# Patient Record
Sex: Male | Born: 1965 | Race: White | Hispanic: No | Marital: Single | Smoking: Former smoker
Health system: Southern US, Community
[De-identification: ages and names within clinical notes are randomized; demographics above are authoritative.]

## PROBLEM LIST (undated history)

## (undated) DIAGNOSIS — I38 Endocarditis, valve unspecified: Secondary | ICD-10-CM

## (undated) DIAGNOSIS — F4310 Post-traumatic stress disorder, unspecified: Secondary | ICD-10-CM

## (undated) DIAGNOSIS — K219 Gastro-esophageal reflux disease without esophagitis: Secondary | ICD-10-CM

## (undated) DIAGNOSIS — M109 Gout, unspecified: Secondary | ICD-10-CM

## (undated) DIAGNOSIS — F32A Depression, unspecified: Secondary | ICD-10-CM

## (undated) DIAGNOSIS — N400 Enlarged prostate without lower urinary tract symptoms: Secondary | ICD-10-CM

## (undated) DIAGNOSIS — K5909 Other constipation: Secondary | ICD-10-CM

## (undated) DIAGNOSIS — J449 Chronic obstructive pulmonary disease, unspecified: Secondary | ICD-10-CM

## (undated) DIAGNOSIS — T4145XA Adverse effect of unspecified anesthetic, initial encounter: Secondary | ICD-10-CM

## (undated) DIAGNOSIS — T8859XA Other complications of anesthesia, initial encounter: Secondary | ICD-10-CM

## (undated) DIAGNOSIS — I82611 Acute embolism and thrombosis of superficial veins of right upper extremity: Secondary | ICD-10-CM

## (undated) DIAGNOSIS — J42 Unspecified chronic bronchitis: Secondary | ICD-10-CM

## (undated) DIAGNOSIS — R609 Edema, unspecified: Secondary | ICD-10-CM

## (undated) DIAGNOSIS — G8929 Other chronic pain: Secondary | ICD-10-CM

## (undated) DIAGNOSIS — I10 Essential (primary) hypertension: Secondary | ICD-10-CM

## (undated) DIAGNOSIS — R4182 Altered mental status, unspecified: Secondary | ICD-10-CM

## (undated) DIAGNOSIS — E669 Obesity, unspecified: Secondary | ICD-10-CM

## (undated) DIAGNOSIS — E785 Hyperlipidemia, unspecified: Secondary | ICD-10-CM

## (undated) DIAGNOSIS — F329 Major depressive disorder, single episode, unspecified: Secondary | ICD-10-CM

## (undated) HISTORY — DX: Chronic obstructive pulmonary disease, unspecified: J44.9

## (undated) HISTORY — PX: APPENDECTOMY: SHX54

## (undated) HISTORY — PX: NECK SURGERY: SHX720

## (undated) HISTORY — DX: Essential (primary) hypertension: I10

## (undated) HISTORY — PX: TRACHEOSTOMY: SUR1362

## (undated) HISTORY — PX: ESOPHAGOGASTRODUODENOSCOPY: SHX1529

## (undated) HISTORY — PX: TONSILLECTOMY: SUR1361

---

## 2004-11-25 ENCOUNTER — Emergency Department: Payer: Self-pay | Admitting: Unknown Physician Specialty

## 2007-01-31 ENCOUNTER — Ambulatory Visit: Payer: Self-pay | Admitting: Family Medicine

## 2008-06-19 ENCOUNTER — Ambulatory Visit: Payer: Self-pay | Admitting: Family Medicine

## 2008-09-24 ENCOUNTER — Encounter: Payer: Self-pay | Admitting: Internal Medicine

## 2008-10-01 ENCOUNTER — Encounter: Payer: Self-pay | Admitting: Internal Medicine

## 2008-10-22 ENCOUNTER — Ambulatory Visit: Payer: Self-pay | Admitting: Family Medicine

## 2008-10-27 ENCOUNTER — Encounter: Payer: Self-pay | Admitting: Internal Medicine

## 2008-11-19 ENCOUNTER — Ambulatory Visit: Payer: Self-pay | Admitting: Specialist

## 2008-12-01 ENCOUNTER — Ambulatory Visit: Payer: Self-pay | Admitting: Specialist

## 2009-09-24 ENCOUNTER — Ambulatory Visit: Payer: Self-pay | Admitting: Family Medicine

## 2009-10-09 ENCOUNTER — Ambulatory Visit: Payer: Self-pay | Admitting: Family Medicine

## 2009-10-14 ENCOUNTER — Ambulatory Visit: Payer: Self-pay | Admitting: Specialist

## 2010-07-05 ENCOUNTER — Ambulatory Visit: Payer: Self-pay | Admitting: Surgery

## 2010-07-07 ENCOUNTER — Ambulatory Visit: Payer: Self-pay | Admitting: Surgery

## 2010-07-09 LAB — PATHOLOGY REPORT

## 2011-01-27 ENCOUNTER — Ambulatory Visit: Payer: Self-pay | Admitting: Surgery

## 2011-02-03 ENCOUNTER — Ambulatory Visit: Payer: Self-pay | Admitting: Surgery

## 2011-02-07 LAB — PATHOLOGY REPORT

## 2011-09-05 DIAGNOSIS — L89309 Pressure ulcer of unspecified buttock, unspecified stage: Secondary | ICD-10-CM | POA: Insufficient documentation

## 2012-10-30 DIAGNOSIS — G5640 Causalgia of unspecified upper limb: Secondary | ICD-10-CM | POA: Insufficient documentation

## 2012-10-30 DIAGNOSIS — F4310 Post-traumatic stress disorder, unspecified: Secondary | ICD-10-CM | POA: Insufficient documentation

## 2012-10-30 DIAGNOSIS — M79673 Pain in unspecified foot: Secondary | ICD-10-CM | POA: Insufficient documentation

## 2012-10-30 DIAGNOSIS — M5030 Other cervical disc degeneration, unspecified cervical region: Secondary | ICD-10-CM | POA: Insufficient documentation

## 2012-10-30 DIAGNOSIS — F39 Unspecified mood [affective] disorder: Secondary | ICD-10-CM | POA: Insufficient documentation

## 2012-12-28 DIAGNOSIS — F329 Major depressive disorder, single episode, unspecified: Secondary | ICD-10-CM | POA: Insufficient documentation

## 2012-12-28 DIAGNOSIS — F32A Depression, unspecified: Secondary | ICD-10-CM | POA: Insufficient documentation

## 2013-07-18 ENCOUNTER — Emergency Department: Payer: Self-pay | Admitting: Internal Medicine

## 2013-12-10 ENCOUNTER — Ambulatory Visit: Payer: Self-pay | Admitting: Family Medicine

## 2014-01-02 ENCOUNTER — Emergency Department: Payer: Self-pay | Admitting: Emergency Medicine

## 2014-01-02 LAB — BASIC METABOLIC PANEL
Anion Gap: 4 — ABNORMAL LOW (ref 7–16)
BUN: 16 mg/dL (ref 7–18)
CALCIUM: 8.2 mg/dL — AB (ref 8.5–10.1)
CHLORIDE: 97 mmol/L — AB (ref 98–107)
CREATININE: 1.13 mg/dL (ref 0.60–1.30)
Co2: 29 mmol/L (ref 21–32)
EGFR (African American): >60
EGFR (Non-African Amer.): >60
GLUCOSE: 98 mg/dL (ref 65–99)
Osmolality: 262 (ref 275–301)
Potassium: 3.2 mmol/L — ABNORMAL LOW (ref 3.5–5.1)
SODIUM: 130 mmol/L — AB (ref 136–145)

## 2014-01-02 LAB — CBC
HCT: 36.3 % — ABNORMAL LOW (ref 40.0–52.0)
HGB: 12.2 g/dL — AB (ref 13.0–18.0)
MCH: 29 pg (ref 26.0–34.0)
MCHC: 33.5 g/dL (ref 32.0–36.0)
MCV: 87 fL (ref 80–100)
Platelet: 215 10*3/uL (ref 150–440)
RBC: 4.2 10*6/uL — ABNORMAL LOW (ref 4.40–5.90)
RDW: 15.1 % — ABNORMAL HIGH (ref 11.5–14.5)
WBC: 5.4 10*3/uL (ref 3.8–10.6)

## 2014-01-02 LAB — TROPONIN I: Troponin-I: <0.02 ng/mL

## 2014-05-01 ENCOUNTER — Ambulatory Visit: Payer: Self-pay | Admitting: Gastroenterology

## 2014-05-06 ENCOUNTER — Emergency Department: Payer: Self-pay | Admitting: Emergency Medicine

## 2014-05-06 LAB — URINALYSIS, COMPLETE
BACTERIA: NONE SEEN
Bilirubin,UR: NEGATIVE
Glucose,UR: NEGATIVE mg/dL (ref 0–75)
Ketone: NEGATIVE
LEUKOCYTE ESTERASE: NEGATIVE
Nitrite: NEGATIVE
Ph: 7 (ref 4.5–8.0)
Protein: NEGATIVE
SPECIFIC GRAVITY: 1.011 (ref 1.003–1.030)
Squamous Epithelial: NONE SEEN
WBC UR: NONE SEEN /HPF (ref 0–5)

## 2014-05-06 LAB — CBC WITH DIFFERENTIAL/PLATELET
BASOS ABS: 0.1 10*3/uL (ref 0.0–0.1)
BASOS PCT: 0.9 %
EOS PCT: 4.8 %
Eosinophil #: 0.4 10*3/uL (ref 0.0–0.7)
HCT: 37.6 % — ABNORMAL LOW (ref 40.0–52.0)
HGB: 12.3 g/dL — ABNORMAL LOW (ref 13.0–18.0)
Lymphocyte #: 2.2 10*3/uL (ref 1.0–3.6)
Lymphocyte %: 23.9 %
MCH: 29.7 pg (ref 26.0–34.0)
MCHC: 32.6 g/dL (ref 32.0–36.0)
MCV: 91 fL (ref 80–100)
Monocyte #: 0.7 x10 3/mm (ref 0.2–1.0)
Monocyte %: 7.5 %
Neutrophil #: 5.7 10*3/uL (ref 1.4–6.5)
Neutrophil %: 62.9 %
Platelet: 294 10*3/uL (ref 150–440)
RBC: 4.13 10*6/uL — ABNORMAL LOW (ref 4.40–5.90)
RDW: 16.2 % — AB (ref 11.5–14.5)
WBC: 9 10*3/uL (ref 3.8–10.6)

## 2014-05-06 LAB — COMPREHENSIVE METABOLIC PANEL
Albumin: 3.5 g/dL (ref 3.4–5.0)
Alkaline Phosphatase: 139 U/L — ABNORMAL HIGH
Anion Gap: 9 (ref 7–16)
BUN: 16 mg/dL (ref 7–18)
Bilirubin,Total: 0.2 mg/dL (ref 0.2–1.0)
Calcium, Total: 8.4 mg/dL — ABNORMAL LOW (ref 8.5–10.1)
Chloride: 103 mmol/L (ref 98–107)
Co2: 25 mmol/L (ref 21–32)
Creatinine: 1.18 mg/dL (ref 0.60–1.30)
EGFR (African American): >60
EGFR (Non-African Amer.): >60
Glucose: 132 mg/dL — ABNORMAL HIGH (ref 65–99)
Osmolality: 277 (ref 275–301)
Potassium: 4.3 mmol/L (ref 3.5–5.1)
SGOT(AST): 34 U/L (ref 15–37)
SGPT (ALT): 43 U/L
Sodium: 137 mmol/L (ref 136–145)
Total Protein: 7.5 g/dL (ref 6.4–8.2)

## 2014-06-24 ENCOUNTER — Emergency Department: Payer: Self-pay | Admitting: Emergency Medicine

## 2014-06-24 LAB — BASIC METABOLIC PANEL
Anion Gap: 7 (ref 7–16)
BUN: 20 mg/dL — ABNORMAL HIGH (ref 7–18)
CALCIUM: 8.3 mg/dL — AB (ref 8.5–10.1)
CREATININE: 1.21 mg/dL (ref 0.60–1.30)
Chloride: 103 mmol/L (ref 98–107)
Co2: 26 mmol/L (ref 21–32)
EGFR (African American): >60
EGFR (Non-African Amer.): >60
Glucose: 100 mg/dL — ABNORMAL HIGH (ref 65–99)
Osmolality: 275 (ref 275–301)
Potassium: 4.3 mmol/L (ref 3.5–5.1)
SODIUM: 136 mmol/L (ref 136–145)

## 2014-06-24 LAB — CBC
HCT: 37.9 % — ABNORMAL LOW (ref 40.0–52.0)
HGB: 11.9 g/dL — ABNORMAL LOW (ref 13.0–18.0)
MCH: 29.8 pg (ref 26.0–34.0)
MCHC: 31.5 g/dL — ABNORMAL LOW (ref 32.0–36.0)
MCV: 95 fL (ref 80–100)
Platelet: 241 10*3/uL (ref 150–440)
RBC: 4.01 10*6/uL — AB (ref 4.40–5.90)
RDW: 15.5 % — ABNORMAL HIGH (ref 11.5–14.5)
WBC: 8.6 10*3/uL (ref 3.8–10.6)

## 2014-06-24 LAB — TROPONIN I: Troponin-I: <0.02 ng/mL

## 2014-06-27 ENCOUNTER — Emergency Department: Payer: Self-pay | Admitting: Emergency Medicine

## 2014-06-27 LAB — COMPREHENSIVE METABOLIC PANEL
ALBUMIN: 3.6 g/dL (ref 3.4–5.0)
Alkaline Phosphatase: 136 U/L — ABNORMAL HIGH
Anion Gap: 3 — ABNORMAL LOW (ref 7–16)
BILIRUBIN TOTAL: 0.2 mg/dL (ref 0.2–1.0)
BUN: 13 mg/dL (ref 7–18)
Calcium, Total: 8.2 mg/dL — ABNORMAL LOW (ref 8.5–10.1)
Chloride: 107 mmol/L (ref 98–107)
Co2: 26 mmol/L (ref 21–32)
Creatinine: 1.21 mg/dL (ref 0.60–1.30)
EGFR (African American): >60
GLUCOSE: 88 mg/dL (ref 65–99)
POTASSIUM: 4.3 mmol/L (ref 3.5–5.1)
SGOT(AST): 34 U/L (ref 15–37)
SGPT (ALT): 33 U/L
Sodium: 136 mmol/L (ref 136–145)
Total Protein: 7.1 g/dL (ref 6.4–8.2)

## 2014-06-27 LAB — CBC WITH DIFFERENTIAL/PLATELET
BASOS PCT: 0.7 %
Basophil #: 0 10*3/uL (ref 0.0–0.1)
EOS ABS: 0.3 10*3/uL (ref 0.0–0.7)
EOS PCT: 4.3 %
HCT: 35.1 % — AB (ref 40.0–52.0)
HGB: 11.3 g/dL — ABNORMAL LOW (ref 13.0–18.0)
Lymphocyte #: 1.5 10*3/uL (ref 1.0–3.6)
Lymphocyte %: 20.2 %
MCH: 30.2 pg (ref 26.0–34.0)
MCHC: 32.2 g/dL (ref 32.0–36.0)
MCV: 94 fL (ref 80–100)
MONOS PCT: 6.9 %
Monocyte #: 0.5 x10 3/mm (ref 0.2–1.0)
NEUTROS ABS: 5 10*3/uL (ref 1.4–6.5)
NEUTROS PCT: 67.9 %
PLATELETS: 240 10*3/uL (ref 150–440)
RBC: 3.74 10*6/uL — ABNORMAL LOW (ref 4.40–5.90)
RDW: 15.5 % — AB (ref 11.5–14.5)
WBC: 7.3 10*3/uL (ref 3.8–10.6)

## 2014-06-27 LAB — URINALYSIS, COMPLETE
BILIRUBIN, UR: NEGATIVE
Bacteria: NONE SEEN
GLUCOSE, UR: NEGATIVE mg/dL (ref 0–75)
Ketone: NEGATIVE
LEUKOCYTE ESTERASE: NEGATIVE
Nitrite: NEGATIVE
Ph: 6 (ref 4.5–8.0)
Protein: NEGATIVE
SPECIFIC GRAVITY: 1.01 (ref 1.003–1.030)
WBC UR: 1 /HPF (ref 0–5)

## 2014-06-27 LAB — PROTIME-INR
INR: 1.1
Prothrombin Time: 13.9 secs (ref 11.5–14.7)

## 2014-06-27 LAB — LIPASE, BLOOD: LIPASE: 63 U/L — AB (ref 73–393)

## 2014-07-07 ENCOUNTER — Ambulatory Visit: Payer: Self-pay | Admitting: Gastroenterology

## 2014-07-10 LAB — PATHOLOGY REPORT

## 2014-07-27 ENCOUNTER — Emergency Department: Payer: Self-pay | Admitting: Emergency Medicine

## 2014-07-27 LAB — CBC
HCT: 40.5 % (ref 40.0–52.0)
HGB: 13.2 g/dL (ref 13.0–18.0)
MCH: 30.8 pg (ref 26.0–34.0)
MCHC: 32.6 g/dL (ref 32.0–36.0)
MCV: 95 fL (ref 80–100)
Platelet: 267 10*3/uL (ref 150–440)
RBC: 4.29 10*6/uL — AB (ref 4.40–5.90)
RDW: 14.6 % — ABNORMAL HIGH (ref 11.5–14.5)
WBC: 10.4 10*3/uL (ref 3.8–10.6)

## 2014-07-27 LAB — COMPREHENSIVE METABOLIC PANEL
ANION GAP: 9 (ref 7–16)
Albumin: 3.9 g/dL (ref 3.4–5.0)
Alkaline Phosphatase: 108 U/L
BUN: 25 mg/dL — AB (ref 7–18)
Bilirubin,Total: 0.4 mg/dL (ref 0.2–1.0)
CHLORIDE: 101 mmol/L (ref 98–107)
CREATININE: 1.29 mg/dL (ref 0.60–1.30)
Calcium, Total: 8.4 mg/dL — ABNORMAL LOW (ref 8.5–10.1)
Co2: 25 mmol/L (ref 21–32)
EGFR (African American): >60
EGFR (Non-African Amer.): >60
GLUCOSE: 114 mg/dL — AB (ref 65–99)
Osmolality: 275 (ref 275–301)
Potassium: 4.3 mmol/L (ref 3.5–5.1)
SGOT(AST): 27 U/L (ref 15–37)
SGPT (ALT): 31 U/L
SODIUM: 135 mmol/L — AB (ref 136–145)
TOTAL PROTEIN: 7.4 g/dL (ref 6.4–8.2)

## 2014-07-27 LAB — TSH: Thyroid Stimulating Horm: 1.26 u[IU]/mL

## 2014-07-27 LAB — LIPASE, BLOOD: LIPASE: 57 U/L — AB (ref 73–393)

## 2014-07-27 LAB — PRO B NATRIURETIC PEPTIDE: B-Type Natriuretic Peptide: 108 pg/mL (ref 0–125)

## 2014-07-27 LAB — PROTIME-INR
INR: 1.9
Prothrombin Time: 21.2 secs — ABNORMAL HIGH (ref 11.5–14.7)

## 2014-07-27 LAB — TROPONIN I

## 2014-11-03 DIAGNOSIS — I10 Essential (primary) hypertension: Secondary | ICD-10-CM | POA: Insufficient documentation

## 2014-11-03 DIAGNOSIS — E785 Hyperlipidemia, unspecified: Secondary | ICD-10-CM | POA: Insufficient documentation

## 2014-11-03 DIAGNOSIS — R339 Retention of urine, unspecified: Secondary | ICD-10-CM | POA: Insufficient documentation

## 2014-11-03 DIAGNOSIS — J449 Chronic obstructive pulmonary disease, unspecified: Secondary | ICD-10-CM | POA: Insufficient documentation

## 2014-11-03 DIAGNOSIS — I38 Endocarditis, valve unspecified: Secondary | ICD-10-CM | POA: Insufficient documentation

## 2014-11-06 DIAGNOSIS — R310 Gross hematuria: Secondary | ICD-10-CM | POA: Insufficient documentation

## 2015-02-25 ENCOUNTER — Other Ambulatory Visit: Payer: Self-pay | Admitting: Family Medicine

## 2015-02-25 ENCOUNTER — Other Ambulatory Visit: Payer: Medicare Other

## 2015-02-26 LAB — PROTIME-INR
INR: 2 — AB (ref 0.8–1.2)
PROTHROMBIN TIME: 21.3 s — AB (ref 9.1–12.0)

## 2015-02-27 DIAGNOSIS — G8929 Other chronic pain: Secondary | ICD-10-CM | POA: Insufficient documentation

## 2015-02-27 DIAGNOSIS — M545 Low back pain, unspecified: Secondary | ICD-10-CM | POA: Insufficient documentation

## 2015-03-06 ENCOUNTER — Encounter: Payer: Self-pay | Admitting: Family Medicine

## 2015-04-09 ENCOUNTER — Other Ambulatory Visit: Payer: Self-pay | Admitting: Family Medicine

## 2015-04-09 ENCOUNTER — Other Ambulatory Visit: Payer: Self-pay

## 2015-04-09 DIAGNOSIS — J441 Chronic obstructive pulmonary disease with (acute) exacerbation: Secondary | ICD-10-CM

## 2015-04-09 DIAGNOSIS — I10 Essential (primary) hypertension: Secondary | ICD-10-CM

## 2015-04-09 MED ORDER — METOPROLOL TARTRATE 50 MG PO TABS: 50.0000 mg | ORAL_TABLET | Freq: Two times a day (BID) | ORAL | Status: DC

## 2015-04-23 ENCOUNTER — Other Ambulatory Visit: Payer: Medicare Other

## 2015-04-23 DIAGNOSIS — Z7901 Long term (current) use of anticoagulants: Secondary | ICD-10-CM

## 2015-04-24 LAB — PROTIME-INR
INR: 3.1 — ABNORMAL HIGH (ref 0.8–1.2)
PROTHROMBIN TIME: 32.7 s — AB (ref 9.1–12.0)

## 2015-04-27 ENCOUNTER — Other Ambulatory Visit: Payer: Self-pay | Admitting: Family Medicine

## 2015-04-27 DIAGNOSIS — K219 Gastro-esophageal reflux disease without esophagitis: Secondary | ICD-10-CM

## 2015-05-01 ENCOUNTER — Other Ambulatory Visit: Payer: Self-pay

## 2015-06-02 ENCOUNTER — Other Ambulatory Visit: Payer: Self-pay | Admitting: Family Medicine

## 2015-06-02 DIAGNOSIS — J449 Chronic obstructive pulmonary disease, unspecified: Secondary | ICD-10-CM

## 2015-06-04 ENCOUNTER — Other Ambulatory Visit: Payer: Self-pay

## 2015-06-23 ENCOUNTER — Other Ambulatory Visit: Payer: Medicare Other

## 2015-06-23 DIAGNOSIS — Z7901 Long term (current) use of anticoagulants: Secondary | ICD-10-CM

## 2015-06-24 ENCOUNTER — Other Ambulatory Visit: Payer: Self-pay

## 2015-06-24 LAB — PROTIME-INR
INR: 1.1 (ref 0.8–1.2)
Prothrombin Time: 11.7 s (ref 9.1–12.0)

## 2015-07-10 ENCOUNTER — Other Ambulatory Visit: Payer: Self-pay | Admitting: Family Medicine

## 2015-07-14 ENCOUNTER — Other Ambulatory Visit: Payer: Medicare Other

## 2015-07-14 DIAGNOSIS — Z7901 Long term (current) use of anticoagulants: Secondary | ICD-10-CM

## 2015-07-15 LAB — PROTIME-INR
INR: 2.2 — AB (ref 0.8–1.2)
PROTHROMBIN TIME: 22.1 s — AB (ref 9.1–12.0)

## 2015-07-24 ENCOUNTER — Other Ambulatory Visit: Payer: Self-pay | Admitting: Family Medicine

## 2015-08-31 ENCOUNTER — Other Ambulatory Visit: Payer: Medicare Other

## 2015-08-31 DIAGNOSIS — Z7901 Long term (current) use of anticoagulants: Secondary | ICD-10-CM

## 2015-09-01 LAB — PROTIME-INR
INR: 2.8 — AB (ref 0.8–1.2)
Prothrombin Time: 28.1 s — ABNORMAL HIGH (ref 9.1–12.0)

## 2015-10-20 ENCOUNTER — Other Ambulatory Visit: Payer: Medicare Other

## 2015-10-20 DIAGNOSIS — N400 Enlarged prostate without lower urinary tract symptoms: Secondary | ICD-10-CM

## 2015-10-20 DIAGNOSIS — I10 Essential (primary) hypertension: Secondary | ICD-10-CM

## 2015-10-20 DIAGNOSIS — Z7901 Long term (current) use of anticoagulants: Secondary | ICD-10-CM

## 2015-10-20 DIAGNOSIS — Z125 Encounter for screening for malignant neoplasm of prostate: Secondary | ICD-10-CM

## 2015-10-21 LAB — RENAL FUNCTION PANEL
Albumin: 4.4 g/dL (ref 3.5–5.5)
BUN / CREAT RATIO: 11 (ref 9–20)
BUN: 14 mg/dL (ref 6–24)
CO2: 21 mmol/L (ref 18–29)
CREATININE: 1.3 mg/dL — AB (ref 0.76–1.27)
Calcium: 9.1 mg/dL (ref 8.7–10.2)
Chloride: 96 mmol/L (ref 96–106)
GFR, EST AFRICAN AMERICAN: 73 mL/min/{1.73_m2} (ref >59)
GFR, EST NON AFRICAN AMERICAN: 63 mL/min/{1.73_m2} (ref >59)
GLUCOSE: 123 mg/dL — AB (ref 65–99)
POTASSIUM: 5.2 mmol/L (ref 3.5–5.2)
Phosphorus: 3.9 mg/dL (ref 2.5–4.5)
SODIUM: 136 mmol/L (ref 134–144)

## 2015-10-21 LAB — LIPID PANEL
CHOL/HDL RATIO: 5.3 ratio — AB (ref 0.0–5.0)
Cholesterol, Total: 189 mg/dL (ref 100–199)
HDL: 36 mg/dL — ABNORMAL LOW (ref >39)
LDL CALC: 100 mg/dL — AB (ref 0–99)
Triglycerides: 264 mg/dL — ABNORMAL HIGH (ref 0–149)
VLDL Cholesterol Cal: 53 mg/dL — ABNORMAL HIGH (ref 5–40)

## 2015-10-21 LAB — PROTIME-INR
INR: 2.5 — AB (ref 0.8–1.2)
PROTHROMBIN TIME: 25 s — AB (ref 9.1–12.0)

## 2015-10-21 LAB — PSA

## 2015-11-05 ENCOUNTER — Ambulatory Visit (INDEPENDENT_AMBULATORY_CARE_PROVIDER_SITE_OTHER): Payer: Medicare Other | Admitting: Family Medicine

## 2015-11-05 ENCOUNTER — Encounter: Payer: Self-pay | Admitting: Family Medicine

## 2015-11-05 VITALS — BP 100/64 | HR 82 | Ht 72.0 in | Wt 276.0 lb

## 2015-11-05 DIAGNOSIS — E790 Hyperuricemia without signs of inflammatory arthritis and tophaceous disease: Secondary | ICD-10-CM

## 2015-11-05 DIAGNOSIS — N400 Enlarged prostate without lower urinary tract symptoms: Secondary | ICD-10-CM

## 2015-11-05 DIAGNOSIS — Z114 Encounter for screening for human immunodeficiency virus [HIV]: Secondary | ICD-10-CM | POA: Diagnosis not present

## 2015-11-05 DIAGNOSIS — K219 Gastro-esophageal reflux disease without esophagitis: Secondary | ICD-10-CM

## 2015-11-05 DIAGNOSIS — I482 Chronic atrial fibrillation, unspecified: Secondary | ICD-10-CM

## 2015-11-05 DIAGNOSIS — Z1159 Encounter for screening for other viral diseases: Secondary | ICD-10-CM | POA: Diagnosis not present

## 2015-11-05 MED ORDER — ALLOPURINOL 300 MG PO TABS: ORAL_TABLET | ORAL | Status: DC

## 2015-11-05 MED ORDER — OMEPRAZOLE 20 MG PO CPDR: DELAYED_RELEASE_CAPSULE | ORAL | Status: DC

## 2015-11-05 MED ORDER — TAMSULOSIN HCL 0.4 MG PO CAPS: ORAL_CAPSULE | ORAL | Status: DC

## 2015-11-05 NOTE — Progress Notes (Signed)
Name: Kenneth Wood   MRN: [id number]    DOB: Oct 26, 1965   Date:11/05/2015       Progress Note  Subjective  Chief Complaint  Chief Complaint  Patient presents with  . Gout  . Gastroesophageal Reflux  . Benign Prostatic Hypertrophy    Gastroesophageal Reflux He reports no abdominal pain, no belching, no chest pain, no choking, no coughing, no dysphagia, no early satiety, no globus sensation, no heartburn, no hoarse voice, no nausea, no sore throat, no stridor, no tooth decay, no water brash or no wheezing. This is a chronic problem. The current episode started more than 1 year ago. The problem occurs rarely. The problem has been gradually improving. Nothing aggravates the symptoms. Pertinent negatives include no anemia, melena, muscle weakness, orthopnea or weight loss. There are no known risk factors. He has tried a PPI for the symptoms. The treatment provided moderate relief.  Benign Prostatic Hypertrophy This is a chronic problem. The current episode started more than 1 year ago. The problem is unchanged. Irritative symptoms do not include frequency, nocturia or urgency. Obstructive symptoms do not include dribbling, incomplete emptying, an intermittent stream, a slower stream or straining. Pertinent negatives include no chills, dysuria, hematuria, hesitancy, nausea or vomiting. Nothing aggravates the symptoms. Past treatments include tamsulosin. The treatment provided mild relief.  Other This is a recurrent (hyperuricacidemia) problem. The current episode started more than 1 year ago. The problem has been waxing and waning. Pertinent negatives include no abdominal pain, anorexia, chest pain, chills, coughing, fever, headaches, myalgias, nausea, neck pain, numbness, rash, sore throat, urinary symptoms or vomiting. Nothing aggravates the symptoms. He has tried nothing for the symptoms.    No problem-specific assessment & plan notes found for this encounter.   No past medical history on  file.  Past Surgical History  Procedure Laterality Date  . Neck surgery      AA    No family history on file.  Social History   Social History  . Marital Status: Single    Spouse Name: N/A  . Number of Children: N/A  . Years of Education: N/A   Occupational History  . Not on file.   Social History Main Topics  . Smoking status: Former Research scientist (life sciences)  . Smokeless tobacco: Not on file  . Alcohol Use: Not on file  . Drug Use: Not on file  . Sexual Activity: Not on file   Other Topics Concern  . Not on file   Social History Narrative  . No narrative on file    Allergies  Allergen Reactions  . Oxycodone-Acetaminophen Other (See Comments)  . Shellfish Allergy Other (See Comments)     Review of Systems  Constitutional: Negative for fever, chills, weight loss and malaise/fatigue.  HENT: Negative for ear discharge, ear pain, hoarse voice and sore throat.   Eyes: Negative for blurred vision.  Respiratory: Negative for cough, sputum production, choking, shortness of breath and wheezing.   Cardiovascular: Negative for chest pain, palpitations and leg swelling.  Gastrointestinal: Negative for heartburn, dysphagia, nausea, vomiting, abdominal pain, diarrhea, constipation, blood in stool, melena and anorexia.  Genitourinary: Negative for dysuria, hesitancy, urgency, frequency, hematuria, incomplete emptying and nocturia.  Musculoskeletal: Negative for myalgias, back pain, joint pain, muscle weakness and neck pain.  Skin: Negative for rash.  Neurological: Negative for dizziness, tingling, sensory change, focal weakness, numbness and headaches.  Endo/Heme/Allergies: Negative for environmental allergies and polydipsia. Does not bruise/bleed easily.  Psychiatric/Behavioral: Negative for depression and suicidal ideas. The patient  is not nervous/anxious and does not have insomnia.      Objective  Filed Vitals:   11/05/15 1431  BP: 100/64  Pulse: 82  Height: 6' (1.829 m)  Weight:  276 lb (125.193 kg)    Physical Exam  Constitutional: He is oriented to person, place, and time and well-developed, well-nourished, and in no distress.  HENT:  Head: Normocephalic.  Right Ear: Hearing, tympanic membrane, external ear and ear canal normal.  Left Ear: Hearing, tympanic membrane, external ear and ear canal normal.  Nose: Nose normal.  Mouth/Throat: Oropharynx is clear and moist.  Eyes: Conjunctivae and EOM are normal. Pupils are equal, round, and reactive to light. Right eye exhibits no discharge. Left eye exhibits no discharge. No scleral icterus.  Neck: Normal range of motion. Neck supple. No JVD present. No tracheal deviation present. No thyromegaly present.  Cardiovascular: Normal rate, regular rhythm, normal heart sounds and intact distal pulses.  Exam reveals no gallop and no friction rub.   No murmur heard. Pulmonary/Chest: Breath sounds normal. No respiratory distress. He has no wheezes. He has no rales.  Abdominal: Soft. Bowel sounds are normal. He exhibits no mass. There is no hepatosplenomegaly. There is no tenderness. There is no rebound, no guarding and no CVA tenderness.  Genitourinary: Rectum normal and prostate normal. Rectal exam shows no external hemorrhoid and no internal hemorrhoid.  Musculoskeletal: Normal range of motion. He exhibits no edema or tenderness.  Lymphadenopathy:    He has no cervical adenopathy.  Neurological: He is alert and oriented to person, place, and time. He has normal sensation, normal strength, normal reflexes and intact cranial nerves. No cranial nerve deficit.  Skin: Skin is warm. No rash noted.  Psychiatric: Mood and affect normal.      Assessment & Plan  Problem List Items Addressed This Visit    None    Visit Diagnoses    Gastroesophageal reflux disease, esophagitis presence not specified    -  Primary    Relevant Medications    lubiprostone (AMITIZA) 24 MCG capsule    omeprazole (PRILOSEC) 20 MG capsule    BPH  (benign prostatic hypertrophy)        Relevant Medications    tamsulosin (FLOMAX) 0.4 MG CAPS capsule    Other Relevant Orders    PSA    Hyperuricemia        Relevant Medications    allopurinol (ZYLOPRIM) 300 MG tablet    GERD without esophagitis        Relevant Medications    lubiprostone (AMITIZA) 24 MCG capsule    omeprazole (PRILOSEC) 20 MG capsule    Screening for HIV (human immunodeficiency virus)        Need for hepatitis C screening test        Relevant Orders    Hepatitis C antibody    Chronic atrial fibrillation ([place])        Relevant Orders    INR/PT         Dr. Melea Prezioso [facility]  11/05/2015

## 2015-11-06 LAB — PROTIME-INR
INR: 2.3 — AB (ref 0.8–1.2)
PROTHROMBIN TIME: 23.9 s — AB (ref 9.1–12.0)

## 2015-11-06 LAB — PSA

## 2015-11-06 LAB — HEPATITIS C ANTIBODY: Hep C Virus Ab: <0.1 s/co ratio (ref 0.0–0.9)

## 2015-11-09 ENCOUNTER — Other Ambulatory Visit: Payer: Self-pay

## 2015-11-12 ENCOUNTER — Other Ambulatory Visit: Payer: Self-pay

## 2015-11-30 ENCOUNTER — Other Ambulatory Visit: Payer: Self-pay

## 2015-11-30 MED ORDER — ALBUTEROL SULFATE (2.5 MG/3ML) 0.083% IN NEBU: INHALATION_SOLUTION | RESPIRATORY_TRACT | Status: DC

## 2015-12-15 ENCOUNTER — Other Ambulatory Visit: Payer: Medicare Other

## 2015-12-15 DIAGNOSIS — Z7901 Long term (current) use of anticoagulants: Secondary | ICD-10-CM

## 2015-12-16 LAB — PROTIME-INR
INR: 2 — AB (ref 0.8–1.2)
Prothrombin Time: 20.6 s — ABNORMAL HIGH (ref 9.1–12.0)

## 2015-12-17 ENCOUNTER — Other Ambulatory Visit: Payer: Self-pay

## 2016-01-25 ENCOUNTER — Other Ambulatory Visit: Payer: Medicare Other

## 2016-01-25 DIAGNOSIS — Z7901 Long term (current) use of anticoagulants: Principal | ICD-10-CM

## 2016-01-25 DIAGNOSIS — Z5181 Encounter for therapeutic drug level monitoring: Secondary | ICD-10-CM

## 2016-01-26 LAB — PROTIME-INR
INR: 2.3 — ABNORMAL HIGH (ref 0.8–1.2)
Prothrombin Time: 23.7 s — ABNORMAL HIGH (ref 9.1–12.0)

## 2016-02-01 ENCOUNTER — Other Ambulatory Visit: Payer: Self-pay | Admitting: Family Medicine

## 2016-02-13 ENCOUNTER — Other Ambulatory Visit: Payer: Self-pay | Admitting: Family Medicine

## 2016-03-08 ENCOUNTER — Other Ambulatory Visit: Payer: Self-pay

## 2016-03-08 ENCOUNTER — Other Ambulatory Visit: Payer: Medicare Other

## 2016-03-08 DIAGNOSIS — Z7901 Long term (current) use of anticoagulants: Secondary | ICD-10-CM

## 2016-03-09 LAB — PROTIME-INR
INR: 2.5 — ABNORMAL HIGH (ref 0.8–1.2)
Prothrombin Time: 25.4 s — ABNORMAL HIGH (ref 9.1–12.0)

## 2016-03-17 ENCOUNTER — Other Ambulatory Visit: Payer: Self-pay | Admitting: Family Medicine

## 2016-04-01 ENCOUNTER — Other Ambulatory Visit: Payer: Self-pay | Admitting: Family Medicine

## 2016-05-02 ENCOUNTER — Other Ambulatory Visit: Payer: Self-pay | Admitting: Family Medicine

## 2016-05-09 ENCOUNTER — Other Ambulatory Visit: Payer: Self-pay | Admitting: Family Medicine

## 2016-05-10 ENCOUNTER — Encounter: Payer: Self-pay | Admitting: Family Medicine

## 2016-05-10 ENCOUNTER — Ambulatory Visit (INDEPENDENT_AMBULATORY_CARE_PROVIDER_SITE_OTHER): Payer: Medicare Other | Admitting: Family Medicine

## 2016-05-10 VITALS — BP 120/74 | HR 60 | Ht 72.0 in | Wt 261.0 lb

## 2016-05-10 DIAGNOSIS — I10 Essential (primary) hypertension: Secondary | ICD-10-CM | POA: Diagnosis not present

## 2016-05-10 DIAGNOSIS — Z7901 Long term (current) use of anticoagulants: Secondary | ICD-10-CM | POA: Diagnosis not present

## 2016-05-10 DIAGNOSIS — K219 Gastro-esophageal reflux disease without esophagitis: Secondary | ICD-10-CM | POA: Diagnosis not present

## 2016-05-10 DIAGNOSIS — N400 Enlarged prostate without lower urinary tract symptoms: Secondary | ICD-10-CM

## 2016-05-10 MED ORDER — OMEPRAZOLE 20 MG PO CPDR: DELAYED_RELEASE_CAPSULE | ORAL | 1 refills | Status: DC

## 2016-05-10 MED ORDER — TAMSULOSIN HCL 0.4 MG PO CAPS: ORAL_CAPSULE | ORAL | 1 refills | Status: DC

## 2016-05-10 MED ORDER — LISINOPRIL 10 MG PO TABS: 10.0000 mg | ORAL_TABLET | Freq: Every day | ORAL | 1 refills | Status: DC

## 2016-05-10 MED ORDER — METOPROLOL TARTRATE 50 MG PO TABS: 50.0000 mg | ORAL_TABLET | Freq: Two times a day (BID) | ORAL | 1 refills | Status: DC

## 2016-05-10 NOTE — Progress Notes (Signed)
Name: Kenneth Wood   MRN: [id number]    DOB: July 06, 1966   Date:05/10/2016       Progress Note  Subjective  Chief Complaint  Chief Complaint  Patient presents with  . Gastroesophageal Reflux  . Hypertension  . Benign Prostatic Hypertrophy    Gastroesophageal Reflux  He reports no abdominal pain, no belching, no chest pain, no choking, no coughing, no dysphagia, no early satiety, no globus sensation, no heartburn, no hoarse voice, no nausea, no sore throat, no stridor, no tooth decay, no water brash or no wheezing. This is a chronic problem. The problem occurs rarely. The problem has been gradually improving. The symptoms are aggravated by certain foods. Pertinent negatives include no melena or weight loss. He has tried a PPI for the symptoms. The treatment provided mild relief.  Hypertension  This is a chronic problem. The current episode started more than 1 year ago. The problem has been gradually improving since onset. The problem is controlled. Pertinent negatives include no anxiety, blurred vision, chest pain, headaches, malaise/fatigue, neck pain, orthopnea, palpitations, peripheral edema, PND, shortness of breath or sweats. There are no known risk factors for coronary artery disease. Past treatments include beta blockers and ACE inhibitors. The current treatment provides mild improvement. There are no compliance problems.  There is no history of angina, kidney disease, CAD/MI, CVA, heart failure, left ventricular hypertrophy, PVD, renovascular disease or retinopathy. There is no history of chronic renal disease or a hypertension causing med.  Benign Prostatic Hypertrophy  This is a chronic problem. The current episode started more than 1 year ago. The problem has been waxing and waning since onset. Irritative symptoms include nocturia. Irritative symptoms do not include frequency or urgency. Obstructive symptoms do not include dribbling, incomplete emptying, an intermittent stream, a slower  stream, straining or a weak stream. Pertinent negatives include no chills, dysuria, hematuria or nausea. Past treatments include tamsulosin. The treatment provided moderate relief.    No problem-specific Assessment & Plan notes found for this encounter.   Past Medical History:  Diagnosis Date  . COPD (chronic obstructive pulmonary disease) ([place])   . Hypertension     Past Surgical History:  Procedure Laterality Date  . NECK SURGERY     AA    No family history on file.  Social History   Social History  . Marital status: Single    Spouse name: N/A  . Number of children: N/A  . Years of education: N/A   Occupational History  . Not on file.   Social History Main Topics  . Smoking status: Former Research scientist (life sciences)  . Smokeless tobacco: Not on file  . Alcohol use Not on file  . Drug use: Unknown  . Sexual activity: Not on file   Other Topics Concern  . Not on file   Social History Narrative  . No narrative on file    Allergies  Allergen Reactions  . Oxycodone-Acetaminophen Other (See Comments)  . Shellfish Allergy Other (See Comments)     Review of Systems  Constitutional: Negative for chills, fever, malaise/fatigue and weight loss.  HENT: Negative for ear discharge, ear pain, hoarse voice and sore throat.   Eyes: Negative for blurred vision.  Respiratory: Negative for cough, sputum production, choking, shortness of breath and wheezing.   Cardiovascular: Negative for chest pain, palpitations, orthopnea, leg swelling and PND.  Gastrointestinal: Negative for abdominal pain, blood in stool, constipation, diarrhea, dysphagia, heartburn, melena and nausea.  Genitourinary: Positive for nocturia. Negative for dysuria, frequency,  hematuria, incomplete emptying and urgency.  Musculoskeletal: Negative for back pain, joint pain, myalgias and neck pain.  Skin: Negative for rash.  Neurological: Negative for dizziness, tingling, sensory change, focal weakness and headaches.   Endo/Heme/Allergies: Negative for environmental allergies and polydipsia. Does not bruise/bleed easily.  Psychiatric/Behavioral: Negative for depression and suicidal ideas. The patient is not nervous/anxious and does not have insomnia.      Objective  Vitals:   05/10/16 1425  BP: 120/74  Pulse: 60  Weight: 261 lb (118.4 kg)  Height: 6' (1.829 m)    Physical Exam  Constitutional: He is oriented to person, place, and time and well-developed, well-nourished, and in no distress.  HENT:  Head: Normocephalic.  Right Ear: External ear normal.  Left Ear: External ear normal.  Nose: Nose normal.  Mouth/Throat: Oropharynx is clear and moist.  Eyes: Conjunctivae and EOM are normal. Pupils are equal, round, and reactive to light. Right eye exhibits no discharge. Left eye exhibits no discharge. No scleral icterus.  Neck: Normal range of motion. Neck supple. No JVD present. No tracheal deviation present. No thyromegaly present.  Cardiovascular: Normal rate, regular rhythm, normal heart sounds and intact distal pulses.  Exam reveals no gallop and no friction rub.   No murmur heard. Pulmonary/Chest: Breath sounds normal. No respiratory distress. He has no wheezes. He has no rales.  Abdominal: Soft. Bowel sounds are normal. He exhibits no mass. There is no hepatosplenomegaly. There is no tenderness. There is no rebound, no guarding and no CVA tenderness.  Musculoskeletal: Normal range of motion. He exhibits no edema or tenderness.  Lymphadenopathy:    He has no cervical adenopathy.  Neurological: He is alert and oriented to person, place, and time. He has normal sensation, normal strength and intact cranial nerves. No cranial nerve deficit.  Skin: Skin is warm. No rash noted.  Psychiatric: Mood and affect normal.  Nursing note and vitals reviewed.     Assessment & Plan  Problem List Items Addressed This Visit    None    Visit Diagnoses    Essential hypertension    -  Primary    Relevant Medications   lisinopril (PRINIVIL,ZESTRIL) 10 MG tablet   metoprolol (LOPRESSOR) 50 MG tablet   Other Relevant Orders   Renal Function Panel   BPH (benign prostatic hypertrophy)       Relevant Medications   tamsulosin (FLOMAX) 0.4 MG CAPS capsule   Gastroesophageal reflux disease, esophagitis presence not specified       Relevant Medications   omeprazole (PRILOSEC) 20 MG capsule   GERD without esophagitis       Relevant Medications   omeprazole (PRILOSEC) 20 MG capsule   Anticoagulant long-term use       Relevant Orders   INR/PT        Dr. Simcha Farrington [facility]  05/10/16

## 2016-05-12 LAB — PROTIME-INR
INR: 1.9 — AB (ref 0.8–1.2)
PROTHROMBIN TIME: 19.6 s — AB (ref 9.1–12.0)

## 2016-05-12 LAB — RENAL FUNCTION PANEL
Albumin: 4.1 g/dL (ref 3.5–5.5)
BUN / CREAT RATIO: 13 (ref 9–20)
BUN: 14 mg/dL (ref 6–24)
CALCIUM: 8.9 mg/dL (ref 8.7–10.2)
CO2: 20 mmol/L (ref 18–29)
CREATININE: 1.05 mg/dL (ref 0.76–1.27)
Chloride: 98 mmol/L (ref 96–106)
GFR, EST AFRICAN AMERICAN: 94 mL/min/{1.73_m2} (ref >59)
GFR, EST NON AFRICAN AMERICAN: 81 mL/min/{1.73_m2} (ref >59)
Glucose: 124 mg/dL — ABNORMAL HIGH (ref 65–99)
Phosphorus: 3.8 mg/dL (ref 2.5–4.5)
Potassium: 4.7 mmol/L (ref 3.5–5.2)
SODIUM: 137 mmol/L (ref 134–144)

## 2016-06-15 ENCOUNTER — Other Ambulatory Visit: Payer: Self-pay | Admitting: Family Medicine

## 2016-07-06 ENCOUNTER — Other Ambulatory Visit: Payer: Self-pay | Admitting: Specialist

## 2016-07-06 DIAGNOSIS — R9389 Abnormal findings on diagnostic imaging of other specified body structures: Secondary | ICD-10-CM

## 2016-07-06 DIAGNOSIS — J984 Other disorders of lung: Secondary | ICD-10-CM

## 2016-07-06 DIAGNOSIS — J439 Emphysema, unspecified: Secondary | ICD-10-CM

## 2016-07-11 ENCOUNTER — Other Ambulatory Visit: Payer: Self-pay | Admitting: Family Medicine

## 2016-07-12 ENCOUNTER — Ambulatory Visit
Admission: RE | Admit: 2016-07-12 | Discharge: 2016-07-12 | Disposition: A | Discharge status: Home / Self Care | Payer: Medicare Other | Source: Ambulatory Visit | Attending: Specialist | Admitting: Specialist

## 2016-07-12 ENCOUNTER — Other Ambulatory Visit: Payer: Medicare Other

## 2016-07-12 DIAGNOSIS — J439 Emphysema, unspecified: Secondary | ICD-10-CM | POA: Diagnosis present

## 2016-07-12 DIAGNOSIS — Z7901 Long term (current) use of anticoagulants: Secondary | ICD-10-CM

## 2016-07-12 DIAGNOSIS — J849 Interstitial pulmonary disease, unspecified: Secondary | ICD-10-CM | POA: Insufficient documentation

## 2016-07-13 LAB — PROTIME-INR
INR: 1.9 — AB (ref 0.8–1.2)
PROTHROMBIN TIME: 19.2 s — AB (ref 9.1–12.0)

## 2016-08-04 ENCOUNTER — Other Ambulatory Visit: Payer: Self-pay

## 2016-08-21 ENCOUNTER — Other Ambulatory Visit: Payer: Self-pay | Admitting: Family Medicine

## 2016-08-23 ENCOUNTER — Other Ambulatory Visit: Payer: Medicare Other

## 2016-08-23 DIAGNOSIS — Z7901 Long term (current) use of anticoagulants: Secondary | ICD-10-CM

## 2016-08-24 ENCOUNTER — Other Ambulatory Visit: Payer: Self-pay | Admitting: Family Medicine

## 2016-08-24 LAB — PROTIME-INR
INR: 1.9 — AB (ref 0.8–1.2)
PROTHROMBIN TIME: 19 s — AB (ref 9.1–12.0)

## 2016-09-23 ENCOUNTER — Other Ambulatory Visit: Payer: Self-pay | Admitting: Family Medicine

## 2016-09-30 ENCOUNTER — Other Ambulatory Visit: Payer: Self-pay

## 2016-09-30 DIAGNOSIS — J449 Chronic obstructive pulmonary disease, unspecified: Secondary | ICD-10-CM

## 2016-09-30 MED ORDER — ALBUTEROL SULFATE (2.5 MG/3ML) 0.083% IN NEBU: 2.5000 mg | INHALATION_SOLUTION | Freq: Four times a day (QID) | RESPIRATORY_TRACT | 0 refills | Status: DC | PRN

## 2016-10-17 ENCOUNTER — Other Ambulatory Visit: Payer: Medicare Other

## 2016-10-17 DIAGNOSIS — I10 Essential (primary) hypertension: Secondary | ICD-10-CM

## 2016-10-17 DIAGNOSIS — Z7901 Long term (current) use of anticoagulants: Secondary | ICD-10-CM

## 2016-10-18 LAB — PROTIME-INR
INR: 1.9 — AB (ref 0.8–1.2)
PROTHROMBIN TIME: 19.7 s — AB (ref 9.1–12.0)

## 2016-10-18 LAB — RENAL FUNCTION PANEL
Albumin: 4.1 g/dL (ref 3.5–5.5)
BUN / CREAT RATIO: 13 (ref 9–20)
BUN: 15 mg/dL (ref 6–24)
CO2: 23 mmol/L (ref 18–29)
Calcium: 8.9 mg/dL (ref 8.7–10.2)
Chloride: 97 mmol/L (ref 96–106)
Creatinine, Ser: 1.14 mg/dL (ref 0.76–1.27)
GFR calc Af Amer: 85 mL/min/{1.73_m2} (ref >59)
GFR, EST NON AFRICAN AMERICAN: 74 mL/min/{1.73_m2} (ref >59)
GLUCOSE: 121 mg/dL — AB (ref 65–99)
Phosphorus: 3.9 mg/dL (ref 2.5–4.5)
Potassium: 4.9 mmol/L (ref 3.5–5.2)
SODIUM: 136 mmol/L (ref 134–144)

## 2016-11-15 ENCOUNTER — Other Ambulatory Visit: Payer: Self-pay | Admitting: Family Medicine

## 2016-11-21 ENCOUNTER — Other Ambulatory Visit: Payer: Medicare Other

## 2016-11-21 DIAGNOSIS — Z7901 Long term (current) use of anticoagulants: Secondary | ICD-10-CM

## 2016-11-22 LAB — PROTIME-INR
INR: 2.1 — AB (ref 0.8–1.2)
PROTHROMBIN TIME: 21.1 s — AB (ref 9.1–12.0)

## 2016-12-02 ENCOUNTER — Other Ambulatory Visit: Payer: Self-pay | Admitting: Family Medicine

## 2016-12-02 ENCOUNTER — Encounter: Payer: Self-pay | Admitting: Family Medicine

## 2016-12-02 ENCOUNTER — Ambulatory Visit
Admission: RE | Admit: 2016-12-02 | Discharge: 2016-12-02 | Disposition: A | Discharge status: Home / Self Care | Payer: Medicare Other | Source: Ambulatory Visit | Attending: Family Medicine | Admitting: Family Medicine

## 2016-12-02 ENCOUNTER — Ambulatory Visit (INDEPENDENT_AMBULATORY_CARE_PROVIDER_SITE_OTHER): Payer: Medicare Other | Admitting: Family Medicine

## 2016-12-02 VITALS — BP 124/64 | HR 88 | Ht 73.0 in | Wt 267.0 lb

## 2016-12-02 DIAGNOSIS — I700 Atherosclerosis of aorta: Secondary | ICD-10-CM | POA: Insufficient documentation

## 2016-12-02 DIAGNOSIS — R05 Cough: Secondary | ICD-10-CM | POA: Diagnosis not present

## 2016-12-02 DIAGNOSIS — J449 Chronic obstructive pulmonary disease, unspecified: Secondary | ICD-10-CM | POA: Diagnosis not present

## 2016-12-02 DIAGNOSIS — R1314 Dysphagia, pharyngoesophageal phase: Secondary | ICD-10-CM | POA: Diagnosis not present

## 2016-12-02 DIAGNOSIS — J440 Chronic obstructive pulmonary disease with acute lower respiratory infection: Secondary | ICD-10-CM

## 2016-12-02 DIAGNOSIS — R1310 Dysphagia, unspecified: Secondary | ICD-10-CM | POA: Insufficient documentation

## 2016-12-02 DIAGNOSIS — J209 Acute bronchitis, unspecified: Secondary | ICD-10-CM

## 2016-12-02 DIAGNOSIS — K219 Gastro-esophageal reflux disease without esophagitis: Secondary | ICD-10-CM

## 2016-12-02 MED ORDER — ALBUTEROL SULFATE HFA 108 (90 BASE) MCG/ACT IN AERS: 2.0000 | INHALATION_SPRAY | Freq: Four times a day (QID) | RESPIRATORY_TRACT | 0 refills | Status: DC | PRN

## 2016-12-02 MED ORDER — PREDNISONE 10 MG PO TABS
10.0000 mg | ORAL_TABLET | Freq: Every day | ORAL | 0 refills | Status: DC
Start: 2016-12-02 — End: 2017-10-02

## 2016-12-02 MED ORDER — DOXYCYCLINE HYCLATE 100 MG PO TABS: 100.0000 mg | ORAL_TABLET | Freq: Two times a day (BID) | ORAL | 0 refills | Status: DC

## 2016-12-02 MED ORDER — IPRATROPIUM-ALBUTEROL 0.5-2.5 (3) MG/3ML IN SOLN: 3.0000 mL | Freq: Once | RESPIRATORY_TRACT | Status: AC

## 2016-12-02 NOTE — Progress Notes (Signed)
Name: Kenneth Wood   MRN: [id number]    DOB: March 12, 1966   Date:12/02/2016       Progress Note  Subjective  Chief Complaint  Chief Complaint  Patient presents with  . Thyroid Nodule    Started yesterday. Couldn't hardly talk and felt like he was choking. Pt sounds as though he is wheezing. He states he has oxygen tank at home but it broke, and is in need of a new one(portable). He is having difficulty swallowing.      Breathing Problem  He complains of difficulty breathing, shortness of breath and sputum production. There is no chest tightness, cough, frequent throat clearing, hemoptysis, hoarse voice or wheezing. This is a new problem. The current episode started in the past 7 days. The problem has been gradually worsening. The cough is non-productive. Pertinent negatives include no appetite change, chest pain, dyspnea on exertion, ear congestion, ear pain, fever, headaches, heartburn, malaise/fatigue, myalgias, nasal congestion, orthopnea, PND, postnasal drip, rhinorrhea, sneezing, sore throat, sweats or weight loss. His symptoms are aggravated by pollen. His symptoms are alleviated by beta-agonist. He reports minimal improvement on treatment. His past medical history is significant for COPD.  GI Problem  The primary symptoms include fatigue. Primary symptoms do not include fever, weight loss, abdominal pain, nausea, vomiting, diarrhea, melena, hematochezia, dysuria, myalgias or rash. The illness began more than 7 days ago (difficulty swallowing). The problem has been gradually worsening.  The illness is also significant for dysphagia and constipation. The illness does not include chills, anorexia, odynophagia, bloating, tenesmus, back pain or itching.    No problem-specific Assessment & Plan notes found for this encounter.   Past Medical History:  Diagnosis Date  . COPD (chronic obstructive pulmonary disease) ([place])   . Hypertension     Past Surgical History:  Procedure Laterality  Date  . NECK SURGERY     AA    No family history on file.  Social History   Social History  . Marital status: Single    Spouse name: N/A  . Number of children: N/A  . Years of education: N/A   Occupational History  . Not on file.   Social History Main Topics  . Smoking status: Former Research scientist (life sciences)  . Smokeless tobacco: Never Used  . Alcohol use Not on file  . Drug use: Unknown  . Sexual activity: Not on file   Other Topics Concern  . Not on file   Social History Narrative  . No narrative on file    Allergies  Allergen Reactions  . Percocet [Oxycodone-Acetaminophen]   . Oxycodone-Acetaminophen Other (See Comments)  . Shellfish Allergy Other (See Comments)    Outpatient Medications Prior to Visit  Medication Sig Dispense Refill  . albuterol (PROVENTIL) (2.5 MG/3ML) 0.083% nebulizer solution Take 3 mLs (2.5 mg total) by nebulization every 6 (six) hours as needed for wheezing or shortness of breath. Pt says he has been picking up 3 boxes of 25 vials each- please give him the same thing Dx [id number] 75 vial 0  . allopurinol (ZYLOPRIM) 300 MG tablet TAKE 1 BY MOUTH DAILY 90 tablet 1  . buPROPion (WELLBUTRIN XL) 150 MG 24 hr tablet Take 1 tablet by mouth daily. Dr Jerilynn Mages    . clonazePAM (KLONOPIN) 0.5 MG tablet Take 1 tablet by mouth 2 (two) times daily. Dr Jerilynn Mages    . diphenhydrAMINE (BENADRYL) 50 MG capsule Take 1 capsule by mouth as needed.    . DULoxetine (CYMBALTA) 60 MG capsule Take 1  capsule by mouth 2 (two) times daily. Dr Jerilynn Mages    . lisinopril (PRINIVIL,ZESTRIL) 10 MG tablet Take 1 tablet (10 mg total) by mouth daily. 90 tablet 1  . lubiprostone (AMITIZA) 24 MCG capsule Take by mouth. Dr Jerilynn Mages    . metoprolol (LOPRESSOR) 50 MG tablet Take 1 tablet (50 mg total) by mouth 2 (two) times daily. 180 tablet 1  . morphine (MS CONTIN) 30 MG 12 hr tablet Dr Jerilynn Mages    . morphine (MSIR) 30 MG tablet Dr Jerilynn Mages    . omeprazole (PRILOSEC) 20 MG capsule TAKE 1 BY MOUTH DAILY 90 capsule 1  . predniSONE  (DELTASONE) 10 MG tablet Take by mouth. Dr Raul Del    . PROAIR HFA 108 (90 Base) MCG/ACT inhaler INHALE TWO PUFFS BY MOUTH 4 TIMES DAILY 18 each 0  . tamsulosin (FLOMAX) 0.4 MG CAPS capsule TAKE 1 BY MOUTH DAILY (NEEDS TO SCHEDULE AN APPOINTMENT WITH MD) 90 capsule 1  . buPROPion (WELLBUTRIN XL) 300 MG 24 hr tablet Take 1 tablet by mouth daily. Dr Jerilynn Mages     No facility-administered medications prior to visit.     Review of Systems  Constitutional: Positive for fatigue. Negative for appetite change, chills, fever, malaise/fatigue and weight loss.  HENT: Negative for ear discharge, ear pain, hoarse voice, postnasal drip, rhinorrhea, sneezing and sore throat.   Eyes: Negative for blurred vision.  Respiratory: Positive for sputum production and shortness of breath. Negative for cough, hemoptysis and wheezing.   Cardiovascular: Negative for chest pain, dyspnea on exertion, palpitations, leg swelling and PND.  Gastrointestinal: Positive for constipation and dysphagia. Negative for abdominal pain, anorexia, bloating, blood in stool, diarrhea, heartburn, hematochezia, melena, nausea and vomiting.  Genitourinary: Negative for dysuria, frequency, hematuria and urgency.  Musculoskeletal: Negative for back pain, joint pain, myalgias and neck pain.  Skin: Negative for itching and rash.  Neurological: Negative for dizziness, tingling, sensory change, focal weakness and headaches.  Endo/Heme/Allergies: Negative for environmental allergies and polydipsia. Does not bruise/bleed easily.  Psychiatric/Behavioral: Negative for depression and suicidal ideas. The patient is not nervous/anxious and does not have insomnia.      Objective  Vitals:   12/02/16 1001 12/02/16 1100  BP: 124/64   Pulse: 88   SpO2: 90% 93%  Weight: 267 lb (121.1 kg)   Height: 6\' 1"  (1.854 m)     Physical Exam  Constitutional: He is oriented to person, place, and time and well-developed, well-nourished, and in no distress.  HENT:   Head: Normocephalic.  Right Ear: External ear normal.  Left Ear: External ear normal.  Nose: Nose normal.  Mouth/Throat: Oropharynx is clear and moist.  Eyes: Conjunctivae and EOM are normal. Pupils are equal, round, and reactive to light. Right eye exhibits no discharge. Left eye exhibits no discharge. No scleral icterus.  Neck: Normal range of motion. Neck supple. No JVD present. No tracheal deviation present. No thyromegaly present.  Cardiovascular: Normal rate, regular rhythm, normal heart sounds and intact distal pulses.  Exam reveals no gallop and no friction rub.   No murmur heard. Pulmonary/Chest: No respiratory distress. He has wheezes. He has no rales. He exhibits no tenderness.  Abdominal: Soft. Bowel sounds are normal. He exhibits no mass. There is no hepatosplenomegaly. There is no tenderness. There is no rebound, no guarding and no CVA tenderness.  Musculoskeletal: Normal range of motion. He exhibits no edema or tenderness.  Lymphadenopathy:    He has no cervical adenopathy.  Neurological: He is alert and oriented to  person, place, and time. He has normal sensation, normal strength, normal reflexes and intact cranial nerves. No cranial nerve deficit.  Skin: Skin is warm. No rash noted.  Psychiatric: Mood and affect normal.  Nursing note and vitals reviewed.     Assessment & Plan  Problem List Items Addressed This Visit    None    Visit Diagnoses    COPD, moderate ([place])    -  Primary   Relevant Medications   ipratropium-albuterol (DUONEB) 0.5-2.5 (3) MG/3ML nebulizer solution 3 mL   predniSONE (DELTASONE) 10 MG tablet   albuterol (PROVENTIL HFA;VENTOLIN HFA) 108 (90 Base) MCG/ACT inhaler   Other Relevant Orders   DG Chest 2 View   Acute bronchitis with COPD ([place])       sample symbicort 180   Relevant Medications   ipratropium-albuterol (DUONEB) 0.5-2.5 (3) MG/3ML nebulizer solution 3 mL   predniSONE (DELTASONE) 10 MG tablet   albuterol (PROVENTIL HFA;VENTOLIN  HFA) 108 (90 Base) MCG/ACT inhaler   Other Relevant Orders   DG Chest 2 View   Pharyngoesophageal dysphagia       Relevant Orders   DG Esophagus      Meds ordered this encounter  Medications  . ipratropium-albuterol (DUONEB) 0.5-2.5 (3) MG/3ML nebulizer solution 3 mL  . doxycycline (VIBRA-TABS) 100 MG tablet    Sig: Take 1 tablet (100 mg total) by mouth 2 (two) times daily.    Dispense:  20 tablet    Refill:  0  . predniSONE (DELTASONE) 10 MG tablet    Sig: Take 1 tablet (10 mg total) by mouth daily with breakfast.    Dispense:  21 tablet    Refill:  0  . albuterol (PROVENTIL HFA;VENTOLIN HFA) 108 (90 Base) MCG/ACT inhaler    Sig: Inhale 2 puffs into the lungs every 6 (six) hours as needed for wheezing or shortness of breath.    Dispense:  1 Inhaler    Refill:  0      Dr. [facility]  12/02/16

## 2016-12-09 ENCOUNTER — Ambulatory Visit
Admission: RE | Admit: 2016-12-09 | Discharge: 2016-12-09 | Disposition: A | Discharge status: Home / Self Care | Payer: Medicare Other | Source: Ambulatory Visit | Attending: Family Medicine | Admitting: Family Medicine

## 2016-12-09 DIAGNOSIS — R1314 Dysphagia, pharyngoesophageal phase: Secondary | ICD-10-CM | POA: Diagnosis not present

## 2016-12-09 DIAGNOSIS — R9389 Abnormal findings on diagnostic imaging of other specified body structures: Secondary | ICD-10-CM

## 2016-12-09 DIAGNOSIS — K449 Diaphragmatic hernia without obstruction or gangrene: Secondary | ICD-10-CM | POA: Diagnosis not present

## 2016-12-09 DIAGNOSIS — R938 Abnormal findings on diagnostic imaging of other specified body structures: Secondary | ICD-10-CM | POA: Diagnosis not present

## 2016-12-09 DIAGNOSIS — J984 Other disorders of lung: Secondary | ICD-10-CM

## 2016-12-09 DIAGNOSIS — R911 Solitary pulmonary nodule: Secondary | ICD-10-CM | POA: Diagnosis not present

## 2016-12-09 DIAGNOSIS — R918 Other nonspecific abnormal finding of lung field: Secondary | ICD-10-CM | POA: Diagnosis not present

## 2016-12-09 MED ORDER — IOPAMIDOL (ISOVUE-370) INJECTION 76%: 75.0000 mL | Freq: Once | INTRAVENOUS | Status: DC | PRN

## 2016-12-12 ENCOUNTER — Other Ambulatory Visit: Payer: Self-pay

## 2016-12-12 DIAGNOSIS — K224 Dyskinesia of esophagus: Secondary | ICD-10-CM

## 2016-12-12 DIAGNOSIS — K449 Diaphragmatic hernia without obstruction or gangrene: Secondary | ICD-10-CM

## 2016-12-12 MED ORDER — DOXYCYCLINE HYCLATE 100 MG PO TABS: 100.0000 mg | ORAL_TABLET | Freq: Two times a day (BID) | ORAL | 0 refills | Status: DC

## 2016-12-19 ENCOUNTER — Encounter: Payer: Self-pay | Admitting: Family Medicine

## 2016-12-19 ENCOUNTER — Ambulatory Visit
Admission: RE | Admit: 2016-12-19 | Discharge: 2016-12-19 | Disposition: A | Discharge status: Home / Self Care | Payer: Medicare Other | Source: Ambulatory Visit | Attending: Family Medicine | Admitting: Family Medicine

## 2016-12-19 ENCOUNTER — Ambulatory Visit (INDEPENDENT_AMBULATORY_CARE_PROVIDER_SITE_OTHER): Payer: Medicare Other | Admitting: Family Medicine

## 2016-12-19 VITALS — BP 130/62 | HR 64 | Ht 73.0 in | Wt 264.0 lb

## 2016-12-19 DIAGNOSIS — M79601 Pain in right arm: Secondary | ICD-10-CM | POA: Diagnosis not present

## 2016-12-19 DIAGNOSIS — W19XXXA Unspecified fall, initial encounter: Secondary | ICD-10-CM | POA: Insufficient documentation

## 2016-12-19 DIAGNOSIS — S52601A Unspecified fracture of lower end of right ulna, initial encounter for closed fracture: Secondary | ICD-10-CM | POA: Diagnosis not present

## 2016-12-19 DIAGNOSIS — M25421 Effusion, right elbow: Secondary | ICD-10-CM | POA: Diagnosis not present

## 2016-12-19 DIAGNOSIS — S52501A Unspecified fracture of the lower end of right radius, initial encounter for closed fracture: Secondary | ICD-10-CM | POA: Insufficient documentation

## 2016-12-19 NOTE — Progress Notes (Signed)
Name: Kenneth Wood   MRN: [id number]    DOB: 1966-07-26   Date:12/19/2016       Progress Note  Subjective  Chief Complaint  Chief Complaint  Patient presents with  . Fall    fell back while urinating at toilet and R) arm "jammed" on floor- swelling and pain    Fall  The accident occurred 5 to 7 days ago. Fall occurred: while urinating. He landed on hard floor. There was no blood loss. The point of impact was the right wrist. The pain is present in the right wrist. The pain is at a severity of 4/10. The pain is moderate. The symptoms are aggravated by movement. Pertinent negatives include no abdominal pain, bowel incontinence, fever, headaches, hearing loss, hematuria, loss of consciousness, nausea, numbness, tingling, visual change or vomiting. Treatments tried: pain clinic.    No problem-specific Assessment & Plan notes found for this encounter.   Past Medical History:  Diagnosis Date  . COPD (chronic obstructive pulmonary disease) ([place])   . Hypertension     Past Surgical History:  Procedure Laterality Date  . NECK SURGERY     AA    No family history on file.  Social History   Social History  . Marital status: Single    Spouse name: N/A  . Number of children: N/A  . Years of education: N/A   Occupational History  . Not on file.   Social History Main Topics  . Smoking status: Former Research scientist (life sciences)  . Smokeless tobacco: Never Used  . Alcohol use Not on file  . Drug use: Unknown  . Sexual activity: Not on file   Other Topics Concern  . Not on file   Social History Narrative  . No narrative on file    Allergies  Allergen Reactions  . Percocet [Oxycodone-Acetaminophen]   . Oxycodone-Acetaminophen Other (See Comments)  . Shellfish Allergy Other (See Comments)    Outpatient Medications Prior to Visit  Medication Sig Dispense Refill  . albuterol (PROVENTIL HFA;VENTOLIN HFA) 108 (90 Base) MCG/ACT inhaler Inhale 2 puffs into the lungs every 6 (six) hours as needed  for wheezing or shortness of breath. 1 Inhaler 0  . albuterol (PROVENTIL) (2.5 MG/3ML) 0.083% nebulizer solution Take 3 mLs (2.5 mg total) by nebulization every 6 (six) hours as needed for wheezing or shortness of breath. Pt says he has been picking up 3 boxes of 25 vials each- please give him the same thing Dx code-J44.9 75 vial 0  . allopurinol (ZYLOPRIM) 300 MG tablet TAKE 1 BY MOUTH DAILY 90 tablet 1  . buPROPion (WELLBUTRIN XL) 150 MG 24 hr tablet Take 1 tablet by mouth daily. Dr Jerilynn Mages    . clonazePAM (KLONOPIN) 0.5 MG tablet Take 1 tablet by mouth 2 (two) times daily. Dr Jerilynn Mages    . diphenhydrAMINE (BENADRYL) 50 MG capsule Take 1 capsule by mouth as needed.    . doxycycline (VIBRA-TABS) 100 MG tablet Take 1 tablet (100 mg total) by mouth 2 (two) times daily. 20 tablet 0  . DULoxetine (CYMBALTA) 60 MG capsule Take 1 capsule by mouth 2 (two) times daily. Dr Jerilynn Mages    . lisinopril (PRINIVIL,ZESTRIL) 10 MG tablet Take 1 tablet (10 mg total) by mouth daily. 90 tablet 1  . lubiprostone (AMITIZA) 24 MCG capsule Take by mouth. Dr Jerilynn Mages    . metoprolol (LOPRESSOR) 50 MG tablet Take 1 tablet (50 mg total) by mouth 2 (two) times daily. 180 tablet 1  . morphine (MS CONTIN)  30 MG 12 hr tablet Dr Jerilynn Mages    . morphine (MSIR) 30 MG tablet Dr Jerilynn Mages    . omeprazole (PRILOSEC) 20 MG capsule TAKE ONE CAPSULE BY MOUTH ONCE DAILY 90 capsule 0  . predniSONE (DELTASONE) 10 MG tablet Take by mouth. Dr Raul Del    . predniSONE (DELTASONE) 10 MG tablet Take 1 tablet (10 mg total) by mouth daily with breakfast. 21 tablet 0  . PROAIR HFA 108 (90 Base) MCG/ACT inhaler INHALE TWO PUFFS BY MOUTH 4 TIMES DAILY 18 each 0  . tamsulosin (FLOMAX) 0.4 MG CAPS capsule TAKE 1 BY MOUTH DAILY (NEEDS TO SCHEDULE AN APPOINTMENT WITH MD) 90 capsule 1  . buPROPion (WELLBUTRIN XL) 300 MG 24 hr tablet Take 1 tablet by mouth daily. Dr Jerilynn Mages     Facility-Administered Medications Prior to Visit  Medication Dose Route Frequency Provider Last Rate Last Dose  .  ipratropium-albuterol (DUONEB) 0.5-2.5 (3) MG/3ML nebulizer solution 3 mL  3 mL Nebulization Once Juline Patch, MD        Review of Systems  Constitutional: Negative for chills, fever, malaise/fatigue and weight loss.  HENT: Negative for ear discharge, ear pain and sore throat.   Eyes: Negative for blurred vision.  Respiratory: Negative for cough, sputum production, shortness of breath and wheezing.   Cardiovascular: Negative for chest pain, palpitations and leg swelling.  Gastrointestinal: Negative for abdominal pain, blood in stool, bowel incontinence, constipation, diarrhea, heartburn, melena, nausea and vomiting.  Genitourinary: Negative for dysuria, frequency, hematuria and urgency.  Musculoskeletal: Negative for back pain, joint pain, myalgias and neck pain.  Skin: Negative for rash.  Neurological: Negative for dizziness, tingling, sensory change, focal weakness, loss of consciousness, numbness and headaches.  Endo/Heme/Allergies: Negative for environmental allergies and polydipsia. Does not bruise/bleed easily.  Psychiatric/Behavioral: Negative for depression and suicidal ideas. The patient is not nervous/anxious and does not have insomnia.      Objective  Vitals:   12/19/16 1337  BP: 130/62  Pulse: 64  Weight: 264 lb (119.7 kg)  Height: 6\' 1"  (1.854 m)    Physical Exam  Constitutional: He is oriented to person, place, and time and well-developed, well-nourished, and in no distress.  HENT:  Head: Normocephalic.  Right Ear: External ear normal.  Left Ear: External ear normal.  Nose: Nose normal.  Mouth/Throat: Oropharynx is clear and moist.  Eyes: Conjunctivae and EOM are normal. Pupils are equal, round, and reactive to light. Right eye exhibits no discharge. Left eye exhibits no discharge. No scleral icterus.  Neck: Normal range of motion. Neck supple. No JVD present. No tracheal deviation present. No thyromegaly present.  Cardiovascular: Normal rate, regular rhythm,  normal heart sounds and intact distal pulses.  Exam reveals no gallop and no friction rub.   No murmur heard. Pulmonary/Chest: Breath sounds normal. No respiratory distress. He has no wheezes. He has no rales.  Abdominal: Soft. Bowel sounds are normal. He exhibits no mass. There is no hepatosplenomegaly. There is no tenderness. There is no rebound, no guarding and no CVA tenderness.  Musculoskeletal: He exhibits no edema.       Right wrist: He exhibits decreased range of motion, tenderness, bony tenderness and swelling.  Lymphadenopathy:    He has no cervical adenopathy.  Neurological: He is alert and oriented to person, place, and time. He has normal sensation, normal strength, normal reflexes and intact cranial nerves. No cranial nerve deficit.  Skin: Skin is warm. No rash noted.  Psychiatric: Mood and affect normal.  Nursing note and vitals reviewed.     Assessment & Plan  Problem List Items Addressed This Visit    None    Visit Diagnoses    Right arm pain    -  Primary   Relevant Orders   DG Forearm Right   DG Elbow Complete Right   DG Wrist Complete Right   Fall, initial encounter       Relevant Orders   DG Forearm Right   DG Elbow Complete Right   DG Wrist Complete Right      No orders of the defined types were placed in this encounter.     Dr. [facility]  12/19/16

## 2016-12-20 ENCOUNTER — Other Ambulatory Visit: Payer: Self-pay

## 2016-12-20 DIAGNOSIS — S52601A Unspecified fracture of lower end of right ulna, initial encounter for closed fracture: Secondary | ICD-10-CM

## 2016-12-21 ENCOUNTER — Other Ambulatory Visit: Payer: Self-pay

## 2016-12-26 ENCOUNTER — Other Ambulatory Visit: Payer: Self-pay

## 2016-12-29 ENCOUNTER — Other Ambulatory Visit: Payer: Medicare Other

## 2016-12-29 DIAGNOSIS — Z7901 Long term (current) use of anticoagulants: Secondary | ICD-10-CM

## 2016-12-30 LAB — PROTIME-INR
INR: 2 — ABNORMAL HIGH (ref 0.8–1.2)
Prothrombin Time: 20.3 s — ABNORMAL HIGH (ref 9.1–12.0)

## 2017-01-12 ENCOUNTER — Other Ambulatory Visit: Payer: Self-pay

## 2017-01-15 ENCOUNTER — Other Ambulatory Visit: Payer: Self-pay | Admitting: Family Medicine

## 2017-01-15 DIAGNOSIS — N400 Enlarged prostate without lower urinary tract symptoms: Secondary | ICD-10-CM

## 2017-01-16 ENCOUNTER — Other Ambulatory Visit: Payer: Self-pay

## 2017-01-17 ENCOUNTER — Ambulatory Visit: Payer: Medicare Other | Admitting: Family Medicine

## 2017-01-17 ENCOUNTER — Other Ambulatory Visit: Payer: Self-pay

## 2017-01-17 MED ORDER — WARFARIN SODIUM 4 MG PO TABS: 4.0000 mg | ORAL_TABLET | Freq: Every day | ORAL | 1 refills | Status: DC

## 2017-01-26 ENCOUNTER — Other Ambulatory Visit: Payer: Self-pay | Admitting: Family Medicine

## 2017-01-26 DIAGNOSIS — I10 Essential (primary) hypertension: Secondary | ICD-10-CM

## 2017-02-03 DIAGNOSIS — S52601D Unspecified fracture of lower end of right ulna, subsequent encounter for closed fracture with routine healing: Secondary | ICD-10-CM | POA: Insufficient documentation

## 2017-02-06 ENCOUNTER — Other Ambulatory Visit: Payer: Medicare Other

## 2017-02-06 ENCOUNTER — Other Ambulatory Visit: Payer: Self-pay

## 2017-02-06 ENCOUNTER — Other Ambulatory Visit: Payer: Self-pay | Admitting: Family Medicine

## 2017-02-06 DIAGNOSIS — I10 Essential (primary) hypertension: Secondary | ICD-10-CM

## 2017-02-06 DIAGNOSIS — E790 Hyperuricemia without signs of inflammatory arthritis and tophaceous disease: Secondary | ICD-10-CM

## 2017-02-06 DIAGNOSIS — R042 Hemoptysis: Secondary | ICD-10-CM

## 2017-02-06 DIAGNOSIS — Z7901 Long term (current) use of anticoagulants: Secondary | ICD-10-CM

## 2017-02-06 MED ORDER — LISINOPRIL 10 MG PO TABS: 10.0000 mg | ORAL_TABLET | Freq: Every day | ORAL | 1 refills | Status: DC

## 2017-02-06 MED ORDER — ALLOPURINOL 300 MG PO TABS: ORAL_TABLET | ORAL | 1 refills | Status: DC

## 2017-02-07 ENCOUNTER — Other Ambulatory Visit: Payer: Self-pay

## 2017-02-07 LAB — PROTIME-INR
INR: 3.8 — ABNORMAL HIGH (ref 0.8–1.2)
Prothrombin Time: 37.1 s — ABNORMAL HIGH (ref 9.1–12.0)

## 2017-02-07 MED ORDER — WARFARIN SODIUM 3 MG PO TABS: 3.0000 mg | ORAL_TABLET | Freq: Every day | ORAL | 1 refills | Status: DC

## 2017-02-21 ENCOUNTER — Other Ambulatory Visit: Payer: Medicare Other

## 2017-02-21 DIAGNOSIS — Z7901 Long term (current) use of anticoagulants: Secondary | ICD-10-CM

## 2017-02-22 ENCOUNTER — Other Ambulatory Visit: Payer: Self-pay

## 2017-02-22 LAB — PROTIME-INR
INR: 1.8 — AB (ref 0.8–1.2)
Prothrombin Time: 18.6 s — ABNORMAL HIGH (ref 9.1–12.0)

## 2017-02-27 ENCOUNTER — Other Ambulatory Visit: Payer: Self-pay | Admitting: Family Medicine

## 2017-02-27 DIAGNOSIS — K219 Gastro-esophageal reflux disease without esophagitis: Secondary | ICD-10-CM

## 2017-03-06 ENCOUNTER — Other Ambulatory Visit: Payer: Self-pay | Admitting: Family Medicine

## 2017-03-06 DIAGNOSIS — J209 Acute bronchitis, unspecified: Secondary | ICD-10-CM

## 2017-03-06 DIAGNOSIS — J449 Chronic obstructive pulmonary disease, unspecified: Secondary | ICD-10-CM

## 2017-03-06 DIAGNOSIS — J440 Chronic obstructive pulmonary disease with acute lower respiratory infection: Secondary | ICD-10-CM

## 2017-03-13 ENCOUNTER — Other Ambulatory Visit: Payer: Medicare Other

## 2017-03-13 DIAGNOSIS — Z7901 Long term (current) use of anticoagulants: Secondary | ICD-10-CM

## 2017-03-14 LAB — PROTIME-INR
INR: 1.9 — ABNORMAL HIGH (ref 0.8–1.2)
PROTHROMBIN TIME: 19.6 s — AB (ref 9.1–12.0)

## 2017-04-03 ENCOUNTER — Telehealth: Payer: Self-pay

## 2017-04-03 NOTE — Telephone Encounter (Signed)
Need refills on nebulizer machine stuff pharmacy keeps messing up. Call patient.

## 2017-04-04 ENCOUNTER — Other Ambulatory Visit: Payer: Medicare Other

## 2017-04-04 DIAGNOSIS — Z7901 Long term (current) use of anticoagulants: Secondary | ICD-10-CM

## 2017-04-04 NOTE — Telephone Encounter (Signed)
Done faxed yesterday

## 2017-04-05 LAB — PROTIME-INR
INR: 1.8 — ABNORMAL HIGH (ref 0.8–1.2)
Prothrombin Time: 18.2 s — ABNORMAL HIGH (ref 9.1–12.0)

## 2017-04-10 ENCOUNTER — Encounter: Payer: Self-pay | Admitting: *Deleted

## 2017-04-10 ENCOUNTER — Other Ambulatory Visit: Payer: Self-pay | Admitting: Family Medicine

## 2017-04-10 DIAGNOSIS — J209 Acute bronchitis, unspecified: Secondary | ICD-10-CM

## 2017-04-10 DIAGNOSIS — J440 Chronic obstructive pulmonary disease with acute lower respiratory infection: Secondary | ICD-10-CM

## 2017-04-10 DIAGNOSIS — J449 Chronic obstructive pulmonary disease, unspecified: Secondary | ICD-10-CM

## 2017-04-11 ENCOUNTER — Encounter: Admission: RE | Payer: Self-pay | Source: Ambulatory Visit

## 2017-04-11 ENCOUNTER — Ambulatory Visit: Admission: RE | Admit: 2017-04-11 | Payer: Medicare Other | Source: Ambulatory Visit | Admitting: Gastroenterology

## 2017-04-11 HISTORY — DX: Other chronic pain: G89.29

## 2017-04-11 HISTORY — DX: Benign prostatic hyperplasia without lower urinary tract symptoms: N40.0

## 2017-04-11 HISTORY — DX: Edema, unspecified: R60.9

## 2017-04-11 HISTORY — DX: Gastro-esophageal reflux disease without esophagitis: K21.9

## 2017-04-11 HISTORY — DX: Gout, unspecified: M10.9

## 2017-04-11 HISTORY — DX: Endocarditis, valve unspecified: I38

## 2017-04-11 HISTORY — DX: Hyperlipidemia, unspecified: E78.5

## 2017-04-11 HISTORY — DX: Acute embolism and thrombosis of superficial veins of right upper extremity: I82.611

## 2017-04-11 HISTORY — DX: Obesity, unspecified: E66.9

## 2017-04-11 HISTORY — DX: Major depressive disorder, single episode, unspecified: F32.9

## 2017-04-11 HISTORY — DX: Other constipation: K59.09

## 2017-04-11 HISTORY — DX: Altered mental status, unspecified: R41.82

## 2017-04-11 HISTORY — DX: Depression, unspecified: F32.A

## 2017-04-11 HISTORY — DX: Unspecified chronic bronchitis: J42

## 2017-04-11 SURGERY — ESOPHAGOGASTRODUODENOSCOPY (EGD) WITH PROPOFOL
Anesthesia: General

## 2017-04-13 ENCOUNTER — Other Ambulatory Visit: Payer: Self-pay | Admitting: Family Medicine

## 2017-04-13 DIAGNOSIS — N400 Enlarged prostate without lower urinary tract symptoms: Secondary | ICD-10-CM

## 2017-04-28 ENCOUNTER — Other Ambulatory Visit: Payer: Self-pay | Admitting: Family Medicine

## 2017-04-28 ENCOUNTER — Other Ambulatory Visit: Payer: Self-pay

## 2017-04-28 DIAGNOSIS — I10 Essential (primary) hypertension: Secondary | ICD-10-CM

## 2017-04-28 DIAGNOSIS — J209 Acute bronchitis, unspecified: Secondary | ICD-10-CM

## 2017-04-28 DIAGNOSIS — J440 Chronic obstructive pulmonary disease with acute lower respiratory infection: Secondary | ICD-10-CM

## 2017-04-28 DIAGNOSIS — J449 Chronic obstructive pulmonary disease, unspecified: Secondary | ICD-10-CM

## 2017-04-28 MED ORDER — ALBUTEROL SULFATE HFA 108 (90 BASE) MCG/ACT IN AERS: 1.0000 | INHALATION_SPRAY | Freq: Four times a day (QID) | RESPIRATORY_TRACT | 2 refills | Status: DC | PRN

## 2017-04-28 MED ORDER — ALBUTEROL SULFATE HFA 108 (90 BASE) MCG/ACT IN AERS: 1.0000 | INHALATION_SPRAY | Freq: Four times a day (QID) | RESPIRATORY_TRACT | 3 refills | Status: DC | PRN

## 2017-05-28 ENCOUNTER — Other Ambulatory Visit: Payer: Self-pay | Admitting: Family Medicine

## 2017-05-28 DIAGNOSIS — K219 Gastro-esophageal reflux disease without esophagitis: Secondary | ICD-10-CM

## 2017-05-30 ENCOUNTER — Telehealth: Payer: Self-pay | Admitting: Family Medicine

## 2017-05-30 ENCOUNTER — Other Ambulatory Visit: Payer: Self-pay

## 2017-05-30 ENCOUNTER — Other Ambulatory Visit: Payer: Medicare Other

## 2017-05-30 DIAGNOSIS — Z7901 Long term (current) use of anticoagulants: Secondary | ICD-10-CM

## 2017-05-30 MED ORDER — WARFARIN SODIUM 4 MG PO TABS: 4.0000 mg | ORAL_TABLET | Freq: Every day | ORAL | 1 refills | Status: DC

## 2017-05-30 NOTE — Telephone Encounter (Signed)
Needs refill sent in to [facility] in [place]  warfarin (COUMADIN) 4 MG tablet [[id number]]

## 2017-05-30 NOTE — Telephone Encounter (Signed)
done

## 2017-05-31 LAB — PROTIME-INR
INR: 1.4 — AB (ref 0.8–1.2)
PROTHROMBIN TIME: 13.9 s — AB (ref 9.1–12.0)

## 2017-05-31 NOTE — Telephone Encounter (Signed)
Thank you! You are the best!

## 2017-06-20 ENCOUNTER — Other Ambulatory Visit: Payer: Medicare Other

## 2017-06-20 DIAGNOSIS — Z7901 Long term (current) use of anticoagulants: Secondary | ICD-10-CM

## 2017-06-21 LAB — PROTIME-INR
INR: 1.9 — ABNORMAL HIGH (ref 0.8–1.2)
Prothrombin Time: 18.6 s — ABNORMAL HIGH (ref 9.1–12.0)

## 2017-07-10 ENCOUNTER — Inpatient Hospital Stay: Admission: RE | Admit: 2017-07-10 | Payer: Medicare Other | Source: Ambulatory Visit

## 2017-07-11 ENCOUNTER — Other Ambulatory Visit: Payer: Self-pay

## 2017-07-11 ENCOUNTER — Other Ambulatory Visit: Payer: Self-pay | Admitting: Family Medicine

## 2017-07-11 DIAGNOSIS — N400 Enlarged prostate without lower urinary tract symptoms: Secondary | ICD-10-CM

## 2017-07-23 ENCOUNTER — Other Ambulatory Visit: Payer: Self-pay | Admitting: Family Medicine

## 2017-07-23 DIAGNOSIS — I10 Essential (primary) hypertension: Secondary | ICD-10-CM

## 2017-08-04 ENCOUNTER — Other Ambulatory Visit: Payer: Self-pay | Admitting: Family Medicine

## 2017-08-04 DIAGNOSIS — E790 Hyperuricemia without signs of inflammatory arthritis and tophaceous disease: Secondary | ICD-10-CM

## 2017-08-07 ENCOUNTER — Other Ambulatory Visit: Payer: Self-pay | Admitting: Family Medicine

## 2017-08-07 DIAGNOSIS — J449 Chronic obstructive pulmonary disease, unspecified: Secondary | ICD-10-CM

## 2017-08-08 ENCOUNTER — Other Ambulatory Visit (INDEPENDENT_AMBULATORY_CARE_PROVIDER_SITE_OTHER): Payer: Medicare Other

## 2017-08-08 DIAGNOSIS — Z7901 Long term (current) use of anticoagulants: Secondary | ICD-10-CM

## 2017-08-09 ENCOUNTER — Other Ambulatory Visit: Payer: Self-pay

## 2017-08-09 LAB — PROTIME-INR
INR: 1.2 (ref 0.8–1.2)
Prothrombin Time: 12.6 s — ABNORMAL HIGH (ref 9.1–12.0)

## 2017-08-25 ENCOUNTER — Other Ambulatory Visit: Payer: Self-pay | Admitting: Family Medicine

## 2017-08-25 DIAGNOSIS — K219 Gastro-esophageal reflux disease without esophagitis: Secondary | ICD-10-CM

## 2017-10-02 ENCOUNTER — Encounter: Payer: Self-pay | Admitting: Family Medicine

## 2017-10-02 ENCOUNTER — Ambulatory Visit (INDEPENDENT_AMBULATORY_CARE_PROVIDER_SITE_OTHER): Payer: Medicare Other | Admitting: Family Medicine

## 2017-10-02 VITALS — BP 120/70 | HR 80 | Ht 72.0 in | Wt 278.0 lb

## 2017-10-02 DIAGNOSIS — R5383 Other fatigue: Secondary | ICD-10-CM

## 2017-10-02 DIAGNOSIS — I482 Chronic atrial fibrillation, unspecified: Secondary | ICD-10-CM | POA: Insufficient documentation

## 2017-10-02 DIAGNOSIS — R635 Abnormal weight gain: Secondary | ICD-10-CM
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
## 2017-10-03 LAB — PROTIME-INR
INR: 2.9 — AB (ref 0.8–1.2)
Prothrombin Time: 28.8 s — ABNORMAL HIGH (ref 9.1–12.0)

## 2017-10-03 LAB — TSH: TSH: 2.26 u[IU]/mL (ref 0.450–4.500)

## 2017-10-10 ENCOUNTER — Other Ambulatory Visit: Payer: Self-pay | Admitting: Family Medicine

## 2017-10-10 DIAGNOSIS — N400 Enlarged prostate without lower urinary tract symptoms: Secondary | ICD-10-CM

## 2017-10-12 ENCOUNTER — Other Ambulatory Visit: Payer: Self-pay

## 2017-10-12 ENCOUNTER — Other Ambulatory Visit: Payer: Self-pay | Admitting: Family Medicine

## 2017-10-12 DIAGNOSIS — N400 Enlarged prostate without lower urinary tract symptoms: Secondary | ICD-10-CM

## 2017-10-12 MED ORDER — TAMSULOSIN HCL 0.4 MG PO CAPS: 0.4000 mg | ORAL_CAPSULE | Freq: Every day | ORAL | 0 refills | Status: DC

## 2017-10-14 ENCOUNTER — Other Ambulatory Visit: Payer: Self-pay | Admitting: Family Medicine

## 2017-10-14 DIAGNOSIS — J209 Acute bronchitis, unspecified: Secondary | ICD-10-CM

## 2017-10-14 DIAGNOSIS — J449 Chronic obstructive pulmonary disease, unspecified: Secondary | ICD-10-CM

## 2017-10-14 DIAGNOSIS — J440 Chronic obstructive pulmonary disease with acute lower respiratory infection: Secondary | ICD-10-CM

## 2017-10-27 ENCOUNTER — Other Ambulatory Visit: Payer: Self-pay | Admitting: Family Medicine

## 2017-10-27 DIAGNOSIS — I10 Essential (primary) hypertension: Secondary | ICD-10-CM

## 2017-10-30 ENCOUNTER — Other Ambulatory Visit: Payer: Self-pay | Admitting: Family Medicine

## 2017-10-30 DIAGNOSIS — E790 Hyperuricemia without signs of inflammatory arthritis and tophaceous disease: Secondary | ICD-10-CM

## 2017-11-01 ENCOUNTER — Other Ambulatory Visit: Payer: Self-pay | Admitting: Specialist

## 2017-11-01 DIAGNOSIS — R0602 Shortness of breath: Secondary | ICD-10-CM

## 2017-11-01 DIAGNOSIS — R05 Cough: Secondary | ICD-10-CM

## 2017-11-01 DIAGNOSIS — R053 Chronic cough: Secondary | ICD-10-CM

## 2017-11-06 ENCOUNTER — Other Ambulatory Visit: Payer: Self-pay | Admitting: Family Medicine

## 2017-11-06 DIAGNOSIS — I10 Essential (primary) hypertension: Secondary | ICD-10-CM

## 2017-11-07 ENCOUNTER — Other Ambulatory Visit: Payer: Self-pay

## 2017-11-07 DIAGNOSIS — D689 Coagulation defect, unspecified: Secondary | ICD-10-CM

## 2017-11-08 ENCOUNTER — Other Ambulatory Visit: Payer: Self-pay

## 2017-11-08 ENCOUNTER — Ambulatory Visit
Admission: RE | Admit: 2017-11-08 | Discharge: 2017-11-08 | Disposition: A | Discharge status: Home / Self Care | Payer: Medicare Other | Source: Ambulatory Visit | Attending: Specialist | Admitting: Specialist

## 2017-11-08 DIAGNOSIS — J849 Interstitial pulmonary disease, unspecified: Secondary | ICD-10-CM | POA: Insufficient documentation

## 2017-11-08 DIAGNOSIS — R053 Chronic cough: Secondary | ICD-10-CM

## 2017-11-08 DIAGNOSIS — I700 Atherosclerosis of aorta: Secondary | ICD-10-CM | POA: Insufficient documentation

## 2017-11-08 DIAGNOSIS — D71 Functional disorders of polymorphonuclear neutrophils: Secondary | ICD-10-CM | POA: Diagnosis not present

## 2017-11-08 DIAGNOSIS — R05 Cough: Secondary | ICD-10-CM | POA: Diagnosis not present

## 2017-11-08 DIAGNOSIS — J449 Chronic obstructive pulmonary disease, unspecified: Secondary | ICD-10-CM

## 2017-11-08 DIAGNOSIS — R0602 Shortness of breath: Secondary | ICD-10-CM | POA: Diagnosis present

## 2017-11-08 LAB — PROTIME-INR
INR: 2.8 — AB (ref 0.8–1.2)
Prothrombin Time: 27.7 s — ABNORMAL HIGH (ref 9.1–12.0)

## 2017-11-08 MED ORDER — ALBUTEROL SULFATE (2.5 MG/3ML) 0.083% IN NEBU: INHALATION_SOLUTION | RESPIRATORY_TRACT | 0 refills | Status: DC

## 2017-11-21 ENCOUNTER — Ambulatory Visit: Payer: Medicare Other | Admitting: Certified Registered Nurse Anesthetist

## 2017-11-21 ENCOUNTER — Other Ambulatory Visit: Payer: Self-pay

## 2017-11-21 ENCOUNTER — Ambulatory Visit
Admission: RE | Admit: 2017-11-21 | Discharge: 2017-11-21 | Disposition: A | Discharge status: Home / Self Care | Payer: Medicare Other | Source: Ambulatory Visit | Attending: Gastroenterology | Admitting: Gastroenterology

## 2017-11-21 ENCOUNTER — Encounter
Admission: RE | Disposition: A | Discharge status: Home / Self Care | Payer: Self-pay | Source: Ambulatory Visit | Attending: Gastroenterology

## 2017-11-21 ENCOUNTER — Encounter: Payer: Self-pay | Admitting: Certified Registered Nurse Anesthetist

## 2017-11-21 DIAGNOSIS — K648 Other hemorrhoids: Secondary | ICD-10-CM | POA: Diagnosis not present

## 2017-11-21 DIAGNOSIS — K219 Gastro-esophageal reflux disease without esophagitis: Secondary | ICD-10-CM | POA: Diagnosis not present

## 2017-11-21 DIAGNOSIS — Z79899 Other long term (current) drug therapy: Secondary | ICD-10-CM | POA: Insufficient documentation

## 2017-11-21 DIAGNOSIS — M109 Gout, unspecified: Secondary | ICD-10-CM | POA: Insufficient documentation

## 2017-11-21 DIAGNOSIS — Z7952 Long term (current) use of systemic steroids: Secondary | ICD-10-CM | POA: Insufficient documentation

## 2017-11-21 DIAGNOSIS — J449 Chronic obstructive pulmonary disease, unspecified: Secondary | ICD-10-CM | POA: Diagnosis not present

## 2017-11-21 DIAGNOSIS — G8929 Other chronic pain: Secondary | ICD-10-CM | POA: Insufficient documentation

## 2017-11-21 DIAGNOSIS — R195 Other fecal abnormalities: Secondary | ICD-10-CM | POA: Diagnosis not present

## 2017-11-21 DIAGNOSIS — I38 Endocarditis, valve unspecified: Secondary | ICD-10-CM | POA: Diagnosis not present

## 2017-11-21 DIAGNOSIS — K5730 Diverticulosis of large intestine without perforation or abscess without bleeding: Secondary | ICD-10-CM | POA: Insufficient documentation

## 2017-11-21 DIAGNOSIS — I10 Essential (primary) hypertension: Secondary | ICD-10-CM | POA: Diagnosis not present

## 2017-11-21 DIAGNOSIS — K210 Gastro-esophageal reflux disease with esophagitis: Secondary | ICD-10-CM | POA: Insufficient documentation

## 2017-11-21 DIAGNOSIS — E669 Obesity, unspecified: Secondary | ICD-10-CM | POA: Insufficient documentation

## 2017-11-21 DIAGNOSIS — Z79891 Long term (current) use of opiate analgesic: Secondary | ICD-10-CM | POA: Insufficient documentation

## 2017-11-21 DIAGNOSIS — N400 Enlarged prostate without lower urinary tract symptoms: Secondary | ICD-10-CM | POA: Insufficient documentation

## 2017-11-21 DIAGNOSIS — F329 Major depressive disorder, single episode, unspecified: Secondary | ICD-10-CM | POA: Diagnosis not present

## 2017-11-21 DIAGNOSIS — F4310 Post-traumatic stress disorder, unspecified: Secondary | ICD-10-CM | POA: Diagnosis not present

## 2017-11-21 DIAGNOSIS — Z87891 Personal history of nicotine dependence: Secondary | ICD-10-CM | POA: Insufficient documentation

## 2017-11-21 DIAGNOSIS — D128 Benign neoplasm of rectum: Secondary | ICD-10-CM | POA: Insufficient documentation

## 2017-11-21 DIAGNOSIS — R1310 Dysphagia, unspecified: Secondary | ICD-10-CM | POA: Diagnosis present

## 2017-11-21 DIAGNOSIS — Z7901 Long term (current) use of anticoagulants: Secondary | ICD-10-CM | POA: Diagnosis not present

## 2017-11-21 DIAGNOSIS — K2950 Unspecified chronic gastritis without bleeding: Secondary | ICD-10-CM | POA: Insufficient documentation

## 2017-11-21 DIAGNOSIS — Z6837 Body mass index (BMI) 37.0-37.9, adult: Secondary | ICD-10-CM | POA: Insufficient documentation

## 2017-11-21 DIAGNOSIS — Z86718 Personal history of other venous thrombosis and embolism: Secondary | ICD-10-CM | POA: Insufficient documentation

## 2017-11-21 DIAGNOSIS — Z9981 Dependence on supplemental oxygen: Secondary | ICD-10-CM | POA: Diagnosis not present

## 2017-11-21 DIAGNOSIS — K228 Other specified diseases of esophagus: Secondary | ICD-10-CM | POA: Insufficient documentation

## 2017-11-21 DIAGNOSIS — K317 Polyp of stomach and duodenum: Secondary | ICD-10-CM | POA: Insufficient documentation

## 2017-11-21 HISTORY — PX: ESOPHAGOGASTRODUODENOSCOPY (EGD) WITH PROPOFOL: SHX5813

## 2017-11-21 HISTORY — DX: Post-traumatic stress disorder, unspecified: F43.10

## 2017-11-21 HISTORY — PX: COLONOSCOPY WITH PROPOFOL: SHX5780

## 2017-11-21 LAB — PROTIME-INR
INR: 1.07
Prothrombin Time: 13.8 seconds (ref 11.4–15.2)

## 2017-11-21 SURGERY — ESOPHAGOGASTRODUODENOSCOPY (EGD) WITH PROPOFOL
Anesthesia: General

## 2017-11-21 MED ORDER — SODIUM CHLORIDE 0.9 % IV SOLN
INTRAVENOUS | Status: DC
  Administered 2017-11-21: 11:00:00 via INTRAVENOUS

## 2017-11-21 MED ORDER — PROPOFOL 10 MG/ML IV BOLUS
INTRAVENOUS | Status: DC | PRN
  Administered 2017-11-21: 25 mg via INTRAVENOUS
  Administered 2017-11-21: 100 mg via INTRAVENOUS

## 2017-11-21 MED ORDER — PROPOFOL 500 MG/50ML IV EMUL
INTRAVENOUS | Status: DC | PRN
  Administered 2017-11-21: 140 ug/kg/min via INTRAVENOUS

## 2017-11-21 MED ORDER — LIDOCAINE HCL (CARDIAC) 20 MG/ML IV SOLN
INTRAVENOUS | Status: DC | PRN
  Administered 2017-11-21: 50 mg via INTRAVENOUS

## 2017-11-21 MED ORDER — BUTAMBEN-TETRACAINE-BENZOCAINE 2-2-14 % EX AERO
INHALATION_SPRAY | CUTANEOUS | Status: AC
  Filled 2017-11-21: qty 5

## 2017-11-21 MED ORDER — BUTAMBEN-TETRACAINE-BENZOCAINE 2-2-14 % EX AERO
1.0000 | INHALATION_SPRAY | Freq: Once | CUTANEOUS | Status: AC
  Administered 2017-11-21: 1 via TOPICAL

## 2017-11-21 MED ORDER — LIDOCAINE HCL (PF) 2 % IJ SOLN
INTRAMUSCULAR | Status: AC
  Filled 2017-11-21: qty 10

## 2017-11-21 MED ORDER — PROPOFOL 500 MG/50ML IV EMUL
INTRAVENOUS | Status: AC
  Filled 2017-11-21: qty 50

## 2017-11-21 MED ORDER — SODIUM CHLORIDE 0.9 % IV SOLN: INTRAVENOUS | Status: DC

## 2017-11-21 MED ORDER — PHENYLEPHRINE HCL 10 MG/ML IJ SOLN
INTRAMUSCULAR | Status: DC | PRN
  Administered 2017-11-21 (×2): 100 ug via INTRAVENOUS
  Administered 2017-11-21: 200 ug via INTRAVENOUS
  Administered 2017-11-21 (×2): 100 ug via INTRAVENOUS
  Administered 2017-11-21: 200 ug via INTRAVENOUS

## 2017-11-21 NOTE — Op Note (Signed)
[facility] Patient Name: Kenneth Wood Procedure Date: 11/21/2017 11:03 AM MRN: [id number] Account #: [id number] Date of Birth: May 12, 1966 Admit Type: Outpatient Age: 52 Room: [facility] ENDO ROOM 3 Gender: Male Note Status: Finalized Procedure:            Upper GI endoscopy Indications:          Dysphagia Providers:            Lollie Sails, MD Referring MD:         Juline Patch, MD (Referring MD) Medicines:            Monitored Anesthesia Care Complications:        No immediate complications. Procedure:            Pre-Anesthesia Assessment:                       - ASA Grade Assessment: IV - A patient with severe                        systemic disease that is a constant threat to life.                       After obtaining informed consent, the endoscope was                        passed under direct vision. Throughout the procedure,                        the patient's blood pressure, pulse, and oxygen                        saturations were monitored continuously. The Endoscope                        was introduced through the mouth, and advanced to the                        third part of duodenum. The upper GI endoscopy was                        accomplished without difficulty. The patient tolerated                        the procedure well. Findings:      Abnormal motility was noted in the lower third of the esophagus and at       the gastroesophageal junction. The cricopharyngeus was normal. There is       spasticity of the esophageal body. The distal esophagus/lower esophageal       sphincter is spastic, but gives up passage to the endoscope. Tertiary       peristaltic waves are noted.      The Z-line was irregular. Biopsies were taken with a cold forceps for       histology.      Diffuse moderate inflammation characterized by congestion (edema),       erythema and friability was found in the gastric body and in the gastric        antrum. Biopsies were taken with a cold forceps for histology. Biopsies       were taken with a cold forceps for Helicobacter  pylori testing.      A single 4 mm semi-sessile polyp with no bleeding and stigmata of recent       bleeding was found on the anterior wall of the gastric antrum. The polyp       was removed with a piecemeal technique using a cold biopsy forceps.       Resection and retrieval were complete.      The examined duodenum was normal.      The cardia and gastric fundus were normal on retroflexion otherwise. Impression:           - Abnormal esophageal motility, suspicious for                        achalasia.                       - Z-line irregular. Biopsied.                       - Bile gastritis. Biopsied.                       - A single gastric polyp. Resected and retrieved.                       - Normal examined duodenum. Recommendation:       - Use Protonix (pantoprazole) 40 mg PO BID for 1 month.                       - Use Protonix (pantoprazole) 40 mg PO daily.                       - Await pathology results.                       - Return to GI clinic in 1 month. Procedure Code(s):    --- Professional ---                       [phone], Esophagogastroduodenoscopy, flexible, transoral;                        with biopsy, single or multiple Diagnosis Code(s):    --- Professional ---                       K22.4, Dyskinesia of esophagus                       K22.8, Other specified diseases of esophagus                       K29.60, Other gastritis without bleeding                       K31.7, Polyp of stomach and duodenum                       R13.10, Dysphagia, unspecified CPT copyright 2016 American Medical Association. All rights reserved. The codes documented in this report are preliminary and upon coder review may  be revised to meet current compliance requirements. Lollie Sails, MD 11/21/2017 11:50:33 AM This report has been signed  electronically. Number of Addenda: 0 Note Initiated On:  11/21/2017 11:03 AM      [facility]

## 2017-11-21 NOTE — Anesthesia Postprocedure Evaluation (Signed)
Anesthesia Post Note  Patient: Kenneth Wood  Procedure(s) Performed: ESOPHAGOGASTRODUODENOSCOPY (EGD) WITH PROPOFOL (N/A ) COLONOSCOPY WITH PROPOFOL (N/A )  Patient location during evaluation: Endoscopy Anesthesia Type: General Level of consciousness: awake and alert and oriented Pain management: pain level controlled Vital Signs Assessment: post-procedure vital signs reviewed and stable Respiratory status: spontaneous breathing, nonlabored ventilation and respiratory function stable Cardiovascular status: blood pressure returned to baseline and stable Postop Assessment: no signs of nausea or vomiting Anesthetic complications: no     Last Vitals:  Vitals:   11/21/17 1242 11/21/17 1252  BP: (!) 97/57 105/79  Pulse: 66 68  Resp: 15 19  Temp:    SpO2: 94% 94%    Last Pain:  Vitals:   11/21/17 1222  TempSrc: Tympanic                 Der Gagliano
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
## 2017-11-21 NOTE — Transfer of Care (Signed)
Immediate Anesthesia Transfer of Care Note  Patient: Kenneth Wood  Procedure(s) Performed: ESOPHAGOGASTRODUODENOSCOPY (EGD) WITH PROPOFOL (N/A ) COLONOSCOPY WITH PROPOFOL (N/A )  Patient Location: PACU and Endoscopy Unit  Anesthesia Type:General  Level of Consciousness: drowsy  Airway & Oxygen Therapy: Patient Spontanous Breathing and Patient connected to nasal cannula oxygen  Post-op Assessment: Report given to RN and Post -op Vital signs reviewed and stable  Post vital signs: Reviewed and stable  Last Vitals:  Vitals:   11/21/17 1023  BP: 122/76  Pulse: 88  Resp: (!) 22  Temp: (!) 36.1 C  SpO2: 94%    Last Pain:  Vitals:   11/21/17 1023  TempSrc: Tympanic         Complications: No apparent anesthesia complications

## 2017-11-21 NOTE — OR Nursing (Signed)
COLONOSCOPY ABORTED. SCOPE ADVANCED TO MID TRANSVERSE COLON. POOR PREP
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
## 2017-11-21 NOTE — Op Note (Signed)
[facility] Gastroenterology Patient Name: Kenneth Wood Procedure Date: 11/21/2017 11:03 AM MRN: [id number] Account #: [id number] Date of Birth: 1966-06-01 Admit Type: Outpatient Age: 52 Room: [facility] ENDO ROOM 3 Gender: Male Note Status: Finalized Procedure:            Colonoscopy Indications:          Positive Cologuard test Providers:            Lollie Sails, MD Medicines:            Monitored Anesthesia Care Complications:        No immediate complications. Procedure:            Pre-Anesthesia Assessment:                       - ASA Grade Assessment: IV - A patient with severe                        systemic disease that is a constant threat to life.                       After obtaining informed consent, the colonoscope was                        passed under direct vision. Throughout the procedure,                        the patient's blood pressure, pulse, and oxygen                        saturations were monitored continuously. The                        Colonoscope was introduced through the anus and                        advanced to the the cecum, identified by appendiceal                        orifice and ileocecal valve. The colonoscopy was                        unusually difficult due to poor bowel prep. The patient                        tolerated the procedure well. Findings:      A 10 mm polyp was found in the rectum. The polyp was pedunculated. The       polyp was removed with a cold snare. Resection and retrieval were       complete.      Multiple medium-mouthed diverticula were found in the sigmoid colon and       descending colon.      A large amount of liquid and mall bits of solid stool was found in the       entire colon, precluding visualization and preventing effective suction       and rtinse clearance. I was able to get into the proximal transverse       before visualization was to poor to continue.      Non-bleeding internal  hemorrhoids were found during anoscopy. The  hemorrhoids were small. Impression:           - One 10 mm polyp in the rectum, removed with a cold                        snare. Resected and retrieved.                       - Diverticulosis in the sigmoid colon and in the                        descending colon.                       - Stool in the entire examined colon.                       - Non-bleeding internal hemorrhoids. Recommendation:       - Repeat colonoscopy versus ct colonography.                       - Await pathology results. Procedure Code(s):    --- Professional ---                       [phone], Colonoscopy, flexible; with removal of tumor(s),                        polyp(s), or other lesion(s) by snare technique Diagnosis Code(s):    --- Professional ---                       K62.1, Rectal polyp                       K64.8, Other hemorrhoids                       R19.5, Other fecal abnormalities                       K57.30, Diverticulosis of large intestine without                        perforation or abscess without bleeding CPT copyright 2016 American Medical Association. All rights reserved. The codes documented in this report are preliminary and upon coder review may  be revised to meet current compliance requirements. Lollie Sails, MD 11/21/2017 12:26:39 PM This report has been signed electronically. Number of Addenda: 0 Note Initiated On: 11/21/2017 11:03 AM Total Procedure Duration: 0 hours 23 minutes 28 seconds       [facility]

## 2017-11-21 NOTE — Anesthesia Post-op Follow-up Note (Signed)
Anesthesia QCDR form completed.        

## 2017-11-22 ENCOUNTER — Encounter: Payer: Self-pay | Admitting: Gastroenterology

## 2017-11-22 ENCOUNTER — Other Ambulatory Visit: Payer: Self-pay

## 2017-11-22 LAB — SURGICAL PATHOLOGY

## 2017-11-24 ENCOUNTER — Other Ambulatory Visit: Payer: Self-pay

## 2017-11-24 ENCOUNTER — Telehealth: Payer: Self-pay

## 2017-11-24 DIAGNOSIS — K594 Anal spasm: Secondary | ICD-10-CM

## 2017-11-24 MED ORDER — HYOSCYAMINE SULFATE 0.125 MG PO TABS: 0.1250 mg | ORAL_TABLET | ORAL | 0 refills | Status: DC | PRN

## 2017-11-24 NOTE — Telephone Encounter (Signed)
Put in another call to GI? [place], for pt- he is having rectal spasms "since my colonoscopy". Sent in [place], in case he doesn't get return call today. The rectal cream that needs to be sent in can "only be prescribed by GI" I was told by pharm.

## 2017-12-08 LAB — HM COLONOSCOPY

## 2017-12-26 ENCOUNTER — Other Ambulatory Visit: Payer: Self-pay | Admitting: Family Medicine

## 2017-12-26 DIAGNOSIS — I10 Essential (primary) hypertension: Secondary | ICD-10-CM

## 2017-12-27 ENCOUNTER — Other Ambulatory Visit: Payer: Self-pay

## 2017-12-28 DIAGNOSIS — R768 Other specified abnormal immunological findings in serum: Secondary | ICD-10-CM | POA: Insufficient documentation

## 2018-01-09 ENCOUNTER — Other Ambulatory Visit: Payer: Self-pay

## 2018-01-09 ENCOUNTER — Telehealth: Payer: Self-pay

## 2018-01-09 MED ORDER — WARFARIN SODIUM 3 MG PO TABS: 3.0000 mg | ORAL_TABLET | Freq: Every day | ORAL | 1 refills | Status: DC

## 2018-01-09 NOTE — Telephone Encounter (Signed)
Pt's INR was 5.9 with Dr Raul Del- stop med for 2 days, drop to 3mg  qday and recheck in 2 weeks. Sent 3mg  warfarin in [facility] [place]

## 2018-01-12 ENCOUNTER — Telehealth: Payer: Self-pay

## 2018-01-12 NOTE — Telephone Encounter (Signed)
Pt called with spitting up blood. Hold coumadin x 7 days- restart on Friday the 26th and come in on 29th for Dr Ronnald Ramp appt and recheck INR

## 2018-01-16 ENCOUNTER — Encounter: Payer: Self-pay | Admitting: Family Medicine

## 2018-01-16 ENCOUNTER — Ambulatory Visit (INDEPENDENT_AMBULATORY_CARE_PROVIDER_SITE_OTHER): Payer: Medicare Other | Admitting: Family Medicine

## 2018-01-16 VITALS — BP 132/82 | HR 84 | Ht 72.0 in | Wt 274.0 lb

## 2018-01-16 DIAGNOSIS — Z7901 Long term (current) use of anticoagulants: Secondary | ICD-10-CM | POA: Diagnosis not present

## 2018-01-16 DIAGNOSIS — Z5181 Encounter for therapeutic drug level monitoring: Secondary | ICD-10-CM

## 2018-01-16 DIAGNOSIS — J209 Acute bronchitis, unspecified: Secondary | ICD-10-CM | POA: Diagnosis not present

## 2018-01-16 DIAGNOSIS — J440 Chronic obstructive pulmonary disease with acute lower respiratory infection: Secondary | ICD-10-CM
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
## 2018-01-17 LAB — PROTIME-INR
INR: 1.1 (ref 0.8–1.2)
Prothrombin Time: 11.8 s (ref 9.1–12.0)
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
## 2018-01-22 ENCOUNTER — Encounter: Payer: Self-pay | Admitting: Internal Medicine

## 2018-01-22 ENCOUNTER — Other Ambulatory Visit
Admission: RE | Admit: 2018-01-22 | Discharge: 2018-01-22 | Disposition: A | Discharge status: Home / Self Care | Payer: Medicare Other | Source: Ambulatory Visit | Attending: Internal Medicine | Admitting: Internal Medicine

## 2018-01-22 ENCOUNTER — Ambulatory Visit (INDEPENDENT_AMBULATORY_CARE_PROVIDER_SITE_OTHER): Payer: Medicare Other | Admitting: Internal Medicine

## 2018-01-22 ENCOUNTER — Ambulatory Visit: Payer: Medicare Other | Admitting: Family Medicine

## 2018-01-22 VITALS — BP 150/90 | HR 99 | Resp 16 | Ht 72.0 in | Wt 273.0 lb

## 2018-01-22 DIAGNOSIS — G9009 Other idiopathic peripheral autonomic neuropathy: Secondary | ICD-10-CM | POA: Insufficient documentation

## 2018-01-22 DIAGNOSIS — J42 Unspecified chronic bronchitis: Secondary | ICD-10-CM | POA: Diagnosis not present

## 2018-01-22 DIAGNOSIS — J449 Chronic obstructive pulmonary disease, unspecified: Secondary | ICD-10-CM | POA: Diagnosis not present

## 2018-01-22 DIAGNOSIS — J9611 Chronic respiratory failure with hypoxia: Secondary | ICD-10-CM

## 2018-01-22 DIAGNOSIS — R590 Localized enlarged lymph nodes: Secondary | ICD-10-CM

## 2018-01-22 DIAGNOSIS — J8410 Pulmonary fibrosis, unspecified: Secondary | ICD-10-CM | POA: Insufficient documentation

## 2018-01-22 DIAGNOSIS — S6720XA Crushing injury of unspecified hand, initial encounter: Secondary | ICD-10-CM | POA: Insufficient documentation

## 2018-01-22 NOTE — Patient Instructions (Signed)
Will set you up for a bronchoscopy with biopsies. You need to be off of coumadin for 1 week.

## 2018-01-23 ENCOUNTER — Telehealth: Payer: Self-pay | Admitting: Internal Medicine

## 2018-01-23 LAB — RHEUMATOID FACTOR: Rhuematoid fact SerPl-aCnc: 16.1 IU/mL — ABNORMAL HIGH (ref 0.0–13.9)

## 2018-01-23 LAB — ANGIOTENSIN CONVERTING ENZYME: Angiotensin-Converting Enzyme: <15 U/L (ref 14–82)

## 2018-01-23 LAB — ANCA TITERS: Atypical P-ANCA titer: <1:20 {titer}

## 2018-01-23 NOTE — Telephone Encounter (Signed)
Pt calling stating he need to reschedule his bronchoscopy  Would like a call back about rescheduling

## 2018-01-23 NOTE — Telephone Encounter (Signed)
Returned call to patient> Bronch resubmitted for 02/02/18. Pending scheduling. Nothing further needed.

## 2018-01-23 NOTE — Telephone Encounter (Signed)
EBUS.... 02/02/18 CPT 11572...62035 Mediastinal lymphadenopathy Dr. Ashby Dawes

## 2018-01-29 ENCOUNTER — Telehealth: Payer: Self-pay | Admitting: *Deleted

## 2018-01-29 NOTE — Telephone Encounter (Signed)
Called patient with pre-assessment appt 01/30/18 at 11:30 in [facility]. Patient aware bronch scheduled 02/02/18 he needs to report to medical mall at  12 noon.

## 2018-01-30 ENCOUNTER — Encounter
Admission: RE | Admit: 2018-01-30 | Discharge: 2018-01-30 | Disposition: A | Discharge status: Home / Self Care | Payer: Medicare Other | Source: Ambulatory Visit | Attending: Internal Medicine | Admitting: Internal Medicine

## 2018-01-30 ENCOUNTER — Other Ambulatory Visit: Payer: Self-pay

## 2018-01-30 DIAGNOSIS — Z86718 Personal history of other venous thrombosis and embolism: Secondary | ICD-10-CM | POA: Diagnosis not present

## 2018-01-30 DIAGNOSIS — K5909 Other constipation: Secondary | ICD-10-CM | POA: Diagnosis not present

## 2018-01-30 DIAGNOSIS — Z7951 Long term (current) use of inhaled steroids: Secondary | ICD-10-CM | POA: Diagnosis not present

## 2018-01-30 DIAGNOSIS — J811 Chronic pulmonary edema: Secondary | ICD-10-CM | POA: Diagnosis not present

## 2018-01-30 DIAGNOSIS — M109 Gout, unspecified: Secondary | ICD-10-CM | POA: Diagnosis not present

## 2018-01-30 DIAGNOSIS — N400 Enlarged prostate without lower urinary tract symptoms: Secondary | ICD-10-CM | POA: Diagnosis not present

## 2018-01-30 DIAGNOSIS — Z7901 Long term (current) use of anticoagulants: Secondary | ICD-10-CM | POA: Diagnosis not present

## 2018-01-30 DIAGNOSIS — Z79891 Long term (current) use of opiate analgesic: Secondary | ICD-10-CM | POA: Diagnosis not present

## 2018-01-30 DIAGNOSIS — I10 Essential (primary) hypertension: Secondary | ICD-10-CM | POA: Diagnosis not present

## 2018-01-30 DIAGNOSIS — Z87891 Personal history of nicotine dependence: Secondary | ICD-10-CM | POA: Diagnosis not present

## 2018-01-30 DIAGNOSIS — K219 Gastro-esophageal reflux disease without esophagitis: Secondary | ICD-10-CM | POA: Diagnosis not present

## 2018-01-30 DIAGNOSIS — R590 Localized enlarged lymph nodes: Secondary | ICD-10-CM | POA: Diagnosis present

## 2018-01-30 DIAGNOSIS — Z79899 Other long term (current) drug therapy: Secondary | ICD-10-CM | POA: Diagnosis not present

## 2018-01-30 DIAGNOSIS — F329 Major depressive disorder, single episode, unspecified: Secondary | ICD-10-CM | POA: Diagnosis not present

## 2018-01-30 DIAGNOSIS — Z7952 Long term (current) use of systemic steroids: Secondary | ICD-10-CM | POA: Diagnosis not present

## 2018-01-30 DIAGNOSIS — J449 Chronic obstructive pulmonary disease, unspecified: Secondary | ICD-10-CM | POA: Diagnosis not present

## 2018-01-30 DIAGNOSIS — Z9981 Dependence on supplemental oxygen: Secondary | ICD-10-CM | POA: Diagnosis not present

## 2018-01-30 DIAGNOSIS — J9610 Chronic respiratory failure, unspecified whether with hypoxia or hypercapnia: Secondary | ICD-10-CM | POA: Diagnosis not present

## 2018-01-30 HISTORY — DX: Adverse effect of unspecified anesthetic, initial encounter: T41.45XA

## 2018-01-30 HISTORY — DX: Other complications of anesthesia, initial encounter: T88.59XA

## 2018-01-30 NOTE — Patient Instructions (Signed)
Your procedure is scheduled on: Friday 02/02/18 Report to Day Surgery. At 12:00.  Remember: Instructions that are not followed completely may result in serious medical risk, up to and including death, or upon the discretion of your surgeon and anesthesiologist your surgery may need to be rescheduled.     _X__ 1. Do not eat food after midnight the night before your procedure.                 No gum chewing or hard candies. You may drink clear liquids up to 2 hours                 before you are scheduled to arrive for your surgery- DO not drink clear                 liquids within 2 hours of the start of your surgery.                 Clear Liquids include:  water, apple juice without pulp, clear carbohydrate                 drink such as Clearfast of [place], Black Coffee or Tea (Do not add                 anything to coffee or tea).  __X__2.  On the morning of surgery brush your teeth with toothpaste and water, you  may rinse your mouth with mouthwash if you wish.  Do not swallow any toothpaste of mouthwash.     _X__ 3.  No Alcohol for 24 hours before or after surgery.   ___ 4.  Do Not Smoke or use e-cigarettes For 24 Hours Prior to Your Surgery.                 Do not use any chewable tobacco products for at least 6 hours prior to                 surgery.  ____  5.  Bring all medications with you on the day of surgery if instructed.   _x___  6.  Notify your doctor if there is any change in your medical condition      (cold, fever, infections).     Do not wear jewelry, make-up, hairpins, clips or nail polish. Do not wear lotions, powders, or perfumes. You may wear deodorant. Do not shave 48 hours prior to surgery. Men may shave face and neck. Do not bring valuables to the hospital.    [facility] is not responsible for any belongings or valuables.  Contacts, dentures or bridgework may not be worn into surgery. Leave your suitcase in the car. After surgery it  may be brought to your room. For patients admitted to the hospital, discharge time is determined by your treatment team.   Patients discharged the day of surgery will not be allowed to drive home.   Please read over the following fact sheets that you were given:   __x_ Take these medicines the morning of surgery with A SIP OF WATER:    1. buPROPion (WELLBUTRIN XL) 150 MG 24 hr tablet  2. busPIRone (BUSPAR) 15 MG tablet  3. cetirizine (ZYRTEC) 10 MG tablet  4.DULoxetine (CYMBALTA) 60 MG capsule  5.isosorbide mononitrate (IMDUR) 30 MG 24 hr tablet  6.metoprolol tartrate (LOPRESSOR) 50 MG tablet             7.morphine (MS CONTIN) 30 MG 12 hr tablet and short acting  on regular schedule             8. pantoprazole (PROTONIX) 40 MG tablet take an extra dose the night before             9.predniSONE (DELTASONE) 20 MG tablet             10tamsulosin (FLOMAX) 0.4 MG CAPS capsule ____ Fleet Enema (as directed)   ____ Use CHG Soap as directed  __x__ Use inhalers on the day of surgery  ____ Stop metformin 2 days prior to surgery    ____ Take 1/2 of usual insulin dose the night before surgery. No insulin the morning          of surgery.   __x__ Stop Coumadin on (Already stopped on 01/26/18)  ____ Stop Anti-inflammatories on    __x__ Stop supplements until after surgery.  Fish oil   ____ Bring C-Pap to the hospital.

## 2018-01-31 ENCOUNTER — Other Ambulatory Visit: Payer: Self-pay | Admitting: Family Medicine

## 2018-01-31 DIAGNOSIS — E790 Hyperuricemia without signs of inflammatory arthritis and tophaceous disease: Secondary | ICD-10-CM

## 2018-02-01 ENCOUNTER — Other Ambulatory Visit: Payer: Self-pay | Admitting: Family Medicine

## 2018-02-01 DIAGNOSIS — J449 Chronic obstructive pulmonary disease, unspecified: Secondary | ICD-10-CM

## 2018-02-02 ENCOUNTER — Encounter
Admission: RE | Disposition: A | Discharge status: Home / Self Care | Payer: Self-pay | Source: Ambulatory Visit | Attending: Internal Medicine

## 2018-02-02 ENCOUNTER — Ambulatory Visit: Payer: Medicare Other | Admitting: Anesthesiology

## 2018-02-02 ENCOUNTER — Ambulatory Visit: Payer: Medicare Other

## 2018-02-02 ENCOUNTER — Encounter: Payer: Self-pay | Admitting: *Deleted

## 2018-02-02 ENCOUNTER — Ambulatory Visit
Admission: RE | Admit: 2018-02-02 | Discharge: 2018-02-02 | Disposition: A | Discharge status: Home / Self Care | Payer: Medicare Other | Source: Ambulatory Visit | Attending: Internal Medicine | Admitting: Internal Medicine

## 2018-02-02 DIAGNOSIS — Z9981 Dependence on supplemental oxygen: Secondary | ICD-10-CM | POA: Insufficient documentation

## 2018-02-02 DIAGNOSIS — R590 Localized enlarged lymph nodes: Secondary | ICD-10-CM | POA: Insufficient documentation

## 2018-02-02 DIAGNOSIS — Z79891 Long term (current) use of opiate analgesic: Secondary | ICD-10-CM | POA: Insufficient documentation

## 2018-02-02 DIAGNOSIS — Z9889 Other specified postprocedural states: Secondary | ICD-10-CM

## 2018-02-02 DIAGNOSIS — N400 Enlarged prostate without lower urinary tract symptoms: Secondary | ICD-10-CM | POA: Insufficient documentation

## 2018-02-02 DIAGNOSIS — Z86718 Personal history of other venous thrombosis and embolism: Secondary | ICD-10-CM | POA: Insufficient documentation

## 2018-02-02 DIAGNOSIS — Z87891 Personal history of nicotine dependence: Secondary | ICD-10-CM | POA: Insufficient documentation

## 2018-02-02 DIAGNOSIS — M109 Gout, unspecified: Secondary | ICD-10-CM | POA: Insufficient documentation

## 2018-02-02 DIAGNOSIS — J449 Chronic obstructive pulmonary disease, unspecified: Secondary | ICD-10-CM | POA: Insufficient documentation

## 2018-02-02 DIAGNOSIS — K5909 Other constipation: Secondary | ICD-10-CM | POA: Insufficient documentation

## 2018-02-02 DIAGNOSIS — F329 Major depressive disorder, single episode, unspecified: Secondary | ICD-10-CM | POA: Insufficient documentation

## 2018-02-02 DIAGNOSIS — R918 Other nonspecific abnormal finding of lung field: Secondary | ICD-10-CM

## 2018-02-02 DIAGNOSIS — Z7901 Long term (current) use of anticoagulants: Secondary | ICD-10-CM | POA: Insufficient documentation

## 2018-02-02 DIAGNOSIS — Z7952 Long term (current) use of systemic steroids: Secondary | ICD-10-CM | POA: Insufficient documentation

## 2018-02-02 DIAGNOSIS — K219 Gastro-esophageal reflux disease without esophagitis: Secondary | ICD-10-CM | POA: Insufficient documentation

## 2018-02-02 DIAGNOSIS — J811 Chronic pulmonary edema: Secondary | ICD-10-CM | POA: Diagnosis not present

## 2018-02-02 DIAGNOSIS — Z79899 Other long term (current) drug therapy: Secondary | ICD-10-CM | POA: Insufficient documentation

## 2018-02-02 DIAGNOSIS — I10 Essential (primary) hypertension: Secondary | ICD-10-CM | POA: Insufficient documentation

## 2018-02-02 DIAGNOSIS — J8410 Pulmonary fibrosis, unspecified: Secondary | ICD-10-CM

## 2018-02-02 DIAGNOSIS — Z7951 Long term (current) use of inhaled steroids: Secondary | ICD-10-CM | POA: Insufficient documentation

## 2018-02-02 DIAGNOSIS — J9610 Chronic respiratory failure, unspecified whether with hypoxia or hypercapnia: Secondary | ICD-10-CM | POA: Insufficient documentation

## 2018-02-02 HISTORY — PX: ENDOBRONCHIAL ULTRASOUND: SHX5096

## 2018-02-02 SURGERY — ENDOBRONCHIAL ULTRASOUND (EBUS)
Anesthesia: General

## 2018-02-02 MED ORDER — PHENYLEPHRINE HCL 10 MG/ML IJ SOLN
INTRAMUSCULAR | Status: DC | PRN
  Administered 2018-02-02: 50 ug via INTRAVENOUS

## 2018-02-02 MED ORDER — BUTAMBEN-TETRACAINE-BENZOCAINE 2-2-14 % EX AERO
1.0000 | INHALATION_SPRAY | Freq: Once | CUTANEOUS | Status: DC
  Filled 2018-02-02: qty 20

## 2018-02-02 MED ORDER — DEXAMETHASONE SODIUM PHOSPHATE 10 MG/ML IJ SOLN
INTRAMUSCULAR | Status: DC | PRN
  Administered 2018-02-02: 5 mg via INTRAVENOUS

## 2018-02-02 MED ORDER — PROPOFOL 10 MG/ML IV BOLUS
INTRAVENOUS | Status: DC | PRN
  Administered 2018-02-02: 120 mg via INTRAVENOUS

## 2018-02-02 MED ORDER — LIDOCAINE HCL 2 % EX GEL
1.0000 "application " | Freq: Once | CUTANEOUS | Status: DC
  Filled 2018-02-02: qty 5

## 2018-02-02 MED ORDER — FENTANYL CITRATE (PF) 100 MCG/2ML IJ SOLN: 25.0000 ug | INTRAMUSCULAR | Status: DC | PRN

## 2018-02-02 MED ORDER — FENTANYL CITRATE (PF) 100 MCG/2ML IJ SOLN
INTRAMUSCULAR | Status: AC
  Filled 2018-02-02: qty 2

## 2018-02-02 MED ORDER — ROCURONIUM BROMIDE 50 MG/5ML IV SOLN
INTRAVENOUS | Status: AC
  Filled 2018-02-02: qty 1

## 2018-02-02 MED ORDER — ONDANSETRON HCL 4 MG/2ML IJ SOLN
INTRAMUSCULAR | Status: DC | PRN
  Administered 2018-02-02: 4 mg via INTRAVENOUS

## 2018-02-02 MED ORDER — LIDOCAINE HCL URETHRAL/MUCOSAL 2 % EX GEL
CUTANEOUS | Status: AC
  Filled 2018-02-02: qty 5

## 2018-02-02 MED ORDER — PHENYLEPHRINE HCL 0.25 % NA SOLN
1.0000 | Freq: Four times a day (QID) | NASAL | Status: DC | PRN
  Filled 2018-02-02: qty 15

## 2018-02-02 MED ORDER — ROCURONIUM BROMIDE 100 MG/10ML IV SOLN
INTRAVENOUS | Status: DC | PRN
  Administered 2018-02-02: 20 mg via INTRAVENOUS

## 2018-02-02 MED ORDER — LIDOCAINE HCL 4 % MT SOLN
OROMUCOSAL | Status: DC | PRN
  Administered 2018-02-02: 4 mL via TOPICAL

## 2018-02-02 MED ORDER — FENTANYL CITRATE (PF) 100 MCG/2ML IJ SOLN
INTRAMUSCULAR | Status: DC | PRN
  Administered 2018-02-02 (×2): 50 ug via INTRAVENOUS

## 2018-02-02 MED ORDER — ONDANSETRON HCL 4 MG/2ML IJ SOLN: 4.0000 mg | Freq: Once | INTRAMUSCULAR | Status: DC | PRN

## 2018-02-02 MED ORDER — SUCCINYLCHOLINE CHLORIDE 20 MG/ML IJ SOLN
INTRAMUSCULAR | Status: AC
  Filled 2018-02-02: qty 1

## 2018-02-02 MED ORDER — LIDOCAINE HCL (CARDIAC) PF 100 MG/5ML IV SOSY
PREFILLED_SYRINGE | INTRAVENOUS | Status: DC | PRN
  Administered 2018-02-02: 60 mg via INTRAVENOUS

## 2018-02-02 MED ORDER — SUCCINYLCHOLINE CHLORIDE 20 MG/ML IJ SOLN
INTRAMUSCULAR | Status: DC | PRN
  Administered 2018-02-02: 100 mg via INTRAVENOUS

## 2018-02-02 MED ORDER — SUGAMMADEX SODIUM 500 MG/5ML IV SOLN
INTRAVENOUS | Status: AC
  Filled 2018-02-02: qty 5

## 2018-02-02 MED ORDER — DEXAMETHASONE SODIUM PHOSPHATE 10 MG/ML IJ SOLN
INTRAMUSCULAR | Status: AC
  Filled 2018-02-02: qty 1

## 2018-02-02 MED ORDER — MIDAZOLAM HCL 2 MG/2ML IJ SOLN
INTRAMUSCULAR | Status: DC | PRN
  Administered 2018-02-02: 2 mg via INTRAVENOUS

## 2018-02-02 MED ORDER — SUGAMMADEX SODIUM 200 MG/2ML IV SOLN
INTRAVENOUS | Status: DC | PRN
  Administered 2018-02-02: 200 mg via INTRAVENOUS

## 2018-02-02 MED ORDER — PROPOFOL 10 MG/ML IV BOLUS
INTRAVENOUS | Status: AC
  Filled 2018-02-02: qty 20

## 2018-02-02 MED ORDER — MIDAZOLAM HCL 2 MG/2ML IJ SOLN
INTRAMUSCULAR | Status: AC
  Filled 2018-02-02: qty 2

## 2018-02-02 MED ORDER — LACTATED RINGERS IV SOLN
INTRAVENOUS | Status: DC
  Administered 2018-02-02: 13:00:00 via INTRAVENOUS

## 2018-02-02 MED ORDER — ONDANSETRON HCL 4 MG/2ML IJ SOLN
INTRAMUSCULAR | Status: AC
  Filled 2018-02-02: qty 2

## 2018-02-02 NOTE — Discharge Instructions (Signed)
Flexible Bronchoscopy, Care After These instructions give you information on caring for yourself after your procedure. Your doctor may also give you more specific instructions. Call your doctor if you have any problems or questions after your procedure. Follow these instructions at home:  Do not eat or drink anything for 2 hours after your procedure. If you try to eat or drink before the medicine wears off, food or drink could go into your lungs. You could also burn yourself.  After 2 hours have passed and when you can cough and gag normally, you may eat soft food and drink liquids slowly.  The day after the test, you may eat your normal diet.  You may do your normal activities.  Keep all doctor visits. Get help right away if:  You get more and more short of breath.  You get light-headed.  You feel like you are going to pass out (faint).  You have chest pain.  You have new problems that worry you.  You cough up more than a little blood.  You cough up more blood than before. This information is not intended to replace advice given to you by your health care provider. Make sure you discuss any questions you have with your health care provider. Document Released: 07/10/2009 Document Revised: 02/18/2016 Document Reviewed: 05/17/2013 Elsevier Interactive Patient Education  2017 [employer].

## 2018-02-02 NOTE — Op Note (Signed)
  [facility]            Bronchoscopy Note   FINDINGS/SUMMARY:  - Abnormal mucosa seen in the right bronchus intermedius, which was thickened, swollen.  There was evidence of external compression from posteriorly, with complete obstruction of the right middle lobe bronchus, and 50% obstruction of the lower lobe bronchi. - Moderate erythema throughout both lungs copious mucosal secretions, there were thick mucous plugs in both lungs which were suctioned with some difficulty, requiring multiple re-insertions of the bronchoscope. - Bronchoalveolar lavage, brushings, transbronchial biopsies taken from the anterior segment of the right upper lobe. - Bronchoalveolar lavage, endobronchial forceps biopsies, brushings taken from the right bronchus intermedius. -EBUS guided lymph node biopsy taken of the right paratracheal node. -Difficult airway.  Indication: Interstitial changes, seen in both lungs, greatest in the right upper lobe. The patient (or their representative) was informed of the risks (including but not limited to bleeding, infection, respiratory failure, lung injury, tooth/oral injury) and benefits of the procedure and gave consent, see chart.   Pre-op diagnosis: Interstitial edema. Post-op diagnosis: Same Estimated blood loss: 10 cc  Medications for procedure: Please see anesthesia notes  Procedure description: After obtaining informed consent, timeout was called to confirm the patient the procedure.  The patient was intubated by anesthesia services please see their notes for further details.  The patient did have a difficult airway. The EBUS bronchoscope was inserted, paratracheal subcarinal and right and left hilar stations were examined, the right paratracheal node appeared to be enlarged.  3 passes were made with good returns. The EBUS scope was then removed, white light bronchoscope was advanced, and an anatomical tumor was undertaken.  All segments were  visualized except for the right middle lobe.  There were no endobronchial lesions noted, there are copious mucosal secretions throughout both lungs which were suctioned with some difficulty.  The bronchoscope had to be removed and reinserted a few times to remove these mucous plugs.  The right bronchus intermedius was narrowed by 50% due to overgrowth and thickening of the bronchial mucosa as well as external compression from posteriorly.  The right lower lobe bronchus could be accessed, however the right middle lobe could not be entered. The bronchoscope was wedged in the right upper lobe, transbronchial biopsies were taken under fluoroscopic guidance.  Transbronchial cytology brushings were also taken, followed by bronchoalveolar lavage sent for cytology and microbiology. Subsequently endobronchial forceps biopsies were taken x3 of the abnormal mucosa seen in the right bronchus intermedius, followed by cytology brushing, bronchoalveolar lavage of the right bronchus intermedius.  As adequate tissue had been obtained, bronchoscope was removed and the procedure was terminated.   Condition post procedure: Stable   Complications: None noted     Deep Ashby Dawes, MD.  Board Certified in Internal Medicine, Pulmonary Medicine, [place], and Sleep Medicine.  [facility] Office Number: [phone]  Patricia Pesa, M.D.   Cheral Marker, M.D  02/02/2018
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
## 2018-02-02 NOTE — Anesthesia Procedure Notes (Signed)
Procedure Name: Intubation Date/Time: 02/02/2018 1:26 PM Performed by: Eben Burow, CRNA Pre-anesthesia Checklist: Patient identified, Emergency Drugs available, Suction available, Patient being monitored and Timeout performed Patient Re-evaluated:Patient Re-evaluated prior to induction Oxygen Delivery Method: Circle system utilized Preoxygenation: Pre-oxygenation with 100% oxygen Induction Type: IV induction Ventilation: Mask ventilation with difficulty Laryngoscope Size: McGraph and 4 Grade View: Grade II Tube type: Oral Tube size: 9.0 mm Number of attempts: 2 Airway Equipment and Method: Bougie stylet Placement Confirmation: ETT inserted through vocal cords under direct vision,  positive ETCO2 and breath sounds checked- equal and bilateral Secured at: 23 cm Tube secured with: Tape Dental Injury: Teeth and Oropharynx as per pre-operative assessment  Difficulty Due To: Difficulty was anticipated, Difficult Airway- due to anterior larynx, Difficult Airway- due to large tongue and Difficult Airway- due to reduced neck mobility Future Recommendations: Recommend- induction with short-acting agent, and alternative techniques readily available

## 2018-02-02 NOTE — Transfer of Care (Signed)
Immediate Anesthesia Transfer of Care Note  Patient: YEE JOSS  Procedure(s) Performed: ENDOBRONCHIAL ULTRASOUND (N/A )  Patient Location: PACU  Anesthesia Type:General  Level of Consciousness: awake, alert  and oriented  Airway & Oxygen Therapy: Patient Spontanous Breathing and Patient connected to face mask oxygen  Post-op Assessment: Report given to RN and Post -op Vital signs reviewed and stable  Post vital signs: Reviewed and stable  Last Vitals:  Vitals Value Taken Time  BP 143/73 02/02/2018  2:30 PM  Temp 36 C 02/02/2018  2:30 PM  Pulse 89 02/02/2018  2:31 PM  Resp 16 02/02/2018  2:31 PM  SpO2 99 % 02/02/2018  2:31 PM  Vitals shown include unvalidated device data.  Last Pain:  Vitals:   02/02/18 1430  TempSrc: Temporal  PainSc:          Complications: No apparent anesthesia complications

## 2018-02-02 NOTE — Anesthesia Post-op Follow-up Note (Signed)
Anesthesia QCDR form completed.        

## 2018-02-03 ENCOUNTER — Encounter: Payer: Self-pay | Admitting: Internal Medicine

## 2018-02-04 LAB — ACID FAST SMEAR (AFB, MYCOBACTERIA): Acid Fast Smear: NEGATIVE

## 2018-02-04 NOTE — Anesthesia Postprocedure Evaluation (Signed)
Anesthesia Post Note  Patient: ROYER CRISTOBAL  Procedure(s) Performed: ENDOBRONCHIAL ULTRASOUND (N/A )  Patient location during evaluation: PACU Anesthesia Type: General Level of consciousness: awake and alert Pain management: pain level controlled Vital Signs Assessment: post-procedure vital signs reviewed and stable Respiratory status: spontaneous breathing, nonlabored ventilation, respiratory function stable and patient connected to nasal cannula oxygen Cardiovascular status: blood pressure returned to baseline and stable Postop Assessment: no apparent nausea or vomiting Anesthetic complications: no     Last Vitals:  Vitals:   02/02/18 1501 02/02/18 1508  BP: (!) 106/43 108/79  Pulse: 87 85  Resp: 17 (!) 22  Temp: (!) 36.2 C (!) 35.7 C  SpO2: 98% 98%    Last Pain:  Vitals:   02/02/18 1508  TempSrc: Temporal  PainSc: 3                  Martha Clan

## 2018-02-05 ENCOUNTER — Other Ambulatory Visit: Payer: Self-pay | Admitting: Internal Medicine

## 2018-02-05 LAB — CULTURE, BAL-QUANTITATIVE

## 2018-02-05 LAB — CULTURE, BAL-QUANTITATIVE W GRAM STAIN: Special Requests: NORMAL

## 2018-02-05 LAB — CYTOLOGY - NON PAP

## 2018-02-05 LAB — SURGICAL PATHOLOGY

## 2018-02-05 MED ORDER — DOXYCYCLINE HYCLATE 100 MG PO CAPS: 100.0000 mg | ORAL_CAPSULE | Freq: Two times a day (BID) | ORAL | 0 refills | Status: AC

## 2018-02-09 ENCOUNTER — Encounter: Payer: Self-pay | Admitting: Internal Medicine

## 2018-02-09 ENCOUNTER — Ambulatory Visit (INDEPENDENT_AMBULATORY_CARE_PROVIDER_SITE_OTHER): Payer: Medicare Other | Admitting: Internal Medicine

## 2018-02-09 VITALS — BP 130/80 | HR 93 | Ht 72.0 in | Wt 276.0 lb

## 2018-02-09 DIAGNOSIS — J9611 Chronic respiratory failure with hypoxia: Secondary | ICD-10-CM

## 2018-02-09 DIAGNOSIS — J8410 Pulmonary fibrosis, unspecified: Secondary | ICD-10-CM

## 2018-02-09 DIAGNOSIS — J449 Chronic obstructive pulmonary disease, unspecified: Secondary | ICD-10-CM

## 2018-02-09 DIAGNOSIS — J42 Unspecified chronic bronchitis: Secondary | ICD-10-CM | POA: Diagnosis not present

## 2018-02-09 MED ORDER — SULFAMETHOXAZOLE-TRIMETHOPRIM 400-80 MG PO TABS: ORAL_TABLET | ORAL | 1 refills | Status: DC

## 2018-02-09 MED ORDER — PREDNISONE 20 MG PO TABS: 40.0000 mg | ORAL_TABLET | Freq: Every day | ORAL | 1 refills | Status: AC

## 2018-02-09 NOTE — Patient Instructions (Signed)
Will increase to prednisone 40 daily.  Take 1 tablet every Monday, Wednesday, Friday.  Follow up with Dr. Raul Del.

## 2018-02-09 NOTE — Progress Notes (Signed)
[name] Consultation      Assessment and Plan:  Mediastinal lymphadenopathy, chronic interstitial changes. -Status post EBUS and endobronchial biopsies, findings consistent with acute on chronic inflammation. No evidence of cancer, there was 20K colonies of pan-sensitive MSSA treated with abx.  - He may benefit from an empiric prolonged course of prednisone (currently on 20 mg daily, will increase to 40 mg daily) repeat CT chest in 1 month, to see if interstitial changes have improved. If not then would recommend surgical lung biopsy.  Will start on Bactrim DS 1 tablet 3 times weekly for PCP prophylaxis while on high-dose prednisone.  Chronic hypoxic respiratory failure. - Etiology uncertain, may be secondary to severe emphysema, versus additional lung disease. - As above continue oxygen supplementation.  Meds ordered this encounter  Medications  . predniSONE (DELTASONE) 20 MG tablet    Sig: Take 2 tablets (40 mg total) by mouth daily.    Dispense:  60 tablet    Refill:  1  . sulfamethoxazole-trimethoprim (BACTRIM) 400-80 MG tablet    Sig: Take 1 tablet every Monday, Wednesday, Friday.    Dispense:  12 tablet    Refill:  1   Return Follow up with Dr. Raul Del. .    Date: 02/09/2018  MRN# [id number] Kenneth Wood 05/03/1966    Kenneth Wood is a 52 y.o. old male seen in consultation for chief complaint of:    Chief Complaint  Patient presents with  . Follow-up    f/u bronch: feeling better since abx: cough: SOB w/activity    HPI:  Patient was recently seen by his pulmonologist Dr. Raul Del for complaint of hemoptysis, while on Coumadin.  Thought to have symptoms and signs suggestive of hypersensitivity pneumonitis.  He was treated with a course of Augmentin, steroids. He underwent EBUS bronchoscopy with biopsies of lymph nodes and endobronchially.  All biopsies were negative for cancer, showed only acute on chronic inflammation.  Cultures showed  only 20 K colonies of pansensitive MSSA. He notes that his breathing is feeling better after the bronchoscopy, as we sucked out a lot of mucus. He has no new complaints.   He remains on oxygen at 3L with activity, 2L at rest and sleep.  He has a history of dysphagia, s/p EGD with achalasia with reflux esophagitis, referred to [facility] on 01/12/18 for esophageal manometry, results not visible.   **Imaging personally reviewed CT chest 11/08/2017; there is a right paratracheal, bi- hilar lymphadenopathy with calcifications.  There is bilateral diffuse mosaic changes in both lungs with early fibrotic changes, these seem to be greatest in the right upper lobe. **Serology 01/22/2018; ACE, rheumatoid factor, ANCA negative.  Family Hx:  No family history on file. Social Hx:   Social History   Tobacco Use  . Smoking status: Former Smoker    Packs/day: 2.00    Years: 15.00    Pack years: 30.00    Types: Cigarettes    Last attempt to quit: 09/26/1993    Years since quitting: 24.3  . Smokeless tobacco: Never Used  Substance Use Topics  . Alcohol use: Yes    Alcohol/week: 0.0 oz    Comment: 7.2 oz occassional  . Drug use: No   Medication:    Current Outpatient Medications:  .  albuterol (PROAIR HFA) 108 (90 Base) MCG/ACT inhaler, Inhale 1-2 puffs into the lungs every 6 (six) hours as needed for wheezing or shortness of breath., Disp: 18 g, Rfl: 3 .  albuterol (PROVENTIL) (2.5 MG/3ML)  0.083% nebulizer solution, USE 1 VIAL IN NEBULIZER TWICE DAILY, Disp: 180 vial, Rfl: 0 .  allopurinol (ZYLOPRIM) 300 MG tablet, TAKE 1 TABLET BY MOUTH ONCE DAILY, Disp: 90 tablet, Rfl: 0 .  budesonide-formoterol (SYMBICORT) 160-4.5 MCG/ACT inhaler, Inhale 2 puffs into the lungs 2 (two) times daily., Disp: , Rfl:  .  buPROPion (WELLBUTRIN XL) 150 MG 24 hr tablet, Take 150 mg by mouth every morning. , Disp: , Rfl:  .  buPROPion (WELLBUTRIN XL) 300 MG 24 hr tablet, Take 300 mg by mouth daily at 12 noon. , Disp: , Rfl:  .   busPIRone (BUSPAR) 15 MG tablet, Take 30 mg by mouth daily. , Disp: , Rfl:  .  cetirizine (ZYRTEC) 10 MG tablet, Take 10 mg by mouth daily., Disp: , Rfl:  .  clonazePAM (KLONOPIN) 0.5 MG tablet, Take 0.5 mg by mouth every evening. , Disp: , Rfl:  .  diphenhydrAMINE (BENADRYL) 25 mg capsule, Take 50 mg by mouth daily as needed for allergies. , Disp: , Rfl:  .  doxycycline (VIBRAMYCIN) 100 MG capsule, Take 1 capsule (100 mg total) by mouth 2 (two) times daily for 7 days., Disp: 14 capsule, Rfl: 0 .  DULoxetine (CYMBALTA) 60 MG capsule, Take 60 mg by mouth 2 (two) times daily. , Disp: , Rfl:  .  furosemide (LASIX) 20 MG tablet, Take 20 mg by mouth daily., Disp: , Rfl:  .  isosorbide mononitrate (IMDUR) 30 MG 24 hr tablet, Take 30 mg by mouth daily., Disp: , Rfl:  .  lisinopril (PRINIVIL,ZESTRIL) 10 MG tablet, TAKE 1 TABLET BY MOUTH ONCE DAILY, Disp: 90 tablet, Rfl: 1 .  lubiprostone (AMITIZA) 24 MCG capsule, Take 24 mcg by mouth 2 (two) times daily. , Disp: , Rfl:  .  metoprolol tartrate (LOPRESSOR) 50 MG tablet, TAKE ONE TABLET BY MOUTH TWICE DAILY, Disp: 180 tablet, Rfl: 0 .  morphine (MS CONTIN) 30 MG 12 hr tablet, Take 60 mg by mouth 4 (four) times daily. , Disp: , Rfl:  .  morphine (MSIR) 30 MG tablet, Take 30 mg by mouth 3 (three) times daily. , Disp: , Rfl:  .  pantoprazole (PROTONIX) 40 MG tablet, Take 40 mg by mouth daily. , Disp: , Rfl:  .  predniSONE (DELTASONE) 20 MG tablet, Take 20 mg by mouth daily. , Disp: , Rfl:  .  tamsulosin (FLOMAX) 0.4 MG CAPS capsule, Take 1 capsule (0.4 mg total) by mouth daily., Disp: 90 capsule, Rfl: 0 .  warfarin (COUMADIN) 3 MG tablet, Take 1 tablet (3 mg total) by mouth daily., Disp: 30 tablet, Rfl: 1  Current Facility-Administered Medications:  .  ipratropium-albuterol (DUONEB) 0.5-2.5 (3) MG/3ML nebulizer solution 3 mL, 3 mL, Nebulization, Once, Jones, Iven Finn, MD   Allergies:  Percocet [oxycodone-acetaminophen]; Other; and Shellfish  allergy  Review of Systems: Gen:  Denies  fever, sweats, chills HEENT: Denies blurred vision, double vision. bleeds, sore throat Cvc:  No dizziness, chest pain. Resp:   Denies cough or sputum production, shortness of breath Gi: Denies swallowing difficulty, stomach pain. Gu:  Denies bladder incontinence, burning urine Ext:   No Joint pain, stiffness. Skin: No skin rash,  hives  Endoc:  No polyuria, polydipsia. Psych: No depression, insomnia. Other:  All other systems were reviewed with the patient and were negative other that what is mentioned in the HPI.   Physical Examination:   VS: BP 130/80 (BP Location: Left Arm, Cuff Size: Large)   Pulse 93   Ht 6' (  1.829 m)   Wt 276 lb (125.2 kg)   SpO2 90%   BMI 37.43 kg/m   General Appearance: No distress  Neuro:without focal findings,  speech normal,  HEENT: PERRLA, EOM intact.   Pulmonary: normal breath sounds, No wheezing.  CardiovascularNormal S1,S2.  No m/r/g.   Abdomen: Benign, Soft, non-tender. Renal:  No costovertebral tenderness  GU:  No performed at this time. Endoc: No evident thyromegaly, no signs of acromegaly. Skin:   warm, no rashes, no ecchymosis  Extremities: normal, no cyanosis, clubbing.  Other findings:    LABORATORY PANEL:   CBC No results for input(s): WBC, HGB, HCT, PLT in the last 168 hours. ------------------------------------------------------------------------------------------------------------------  Chemistries  No results for input(s): NA, K, CL, CO2, GLUCOSE, BUN, CREATININE, CALCIUM, MG, AST, ALT, ALKPHOS, BILITOT in the last 168 hours.  Invalid input(s): GFRCGP ------------------------------------------------------------------------------------------------------------------  Cardiac Enzymes No results for input(s): TROPONINI in the last 168 hours. ------------------------------------------------------------  RADIOLOGY:  No results found.     Thank  you for the consultation and for  allowing [facility] to assist in the care of your patient. Our recommendations are noted above.  Please contact us if we can be of further service.   Marda Stalker, MD.  Board Certified in Internal Medicine, Pulmonary Medicine, [place], and Sleep Medicine.  [facility] Office Number: [phone]  Patricia Pesa, M.D.  Merton Border, M.D  02/09/2018

## 2018-02-14 ENCOUNTER — Ambulatory Visit: Payer: Medicare Other | Admitting: Cardiovascular Disease

## 2018-02-20 ENCOUNTER — Other Ambulatory Visit: Payer: Self-pay

## 2018-02-20 ENCOUNTER — Other Ambulatory Visit: Payer: Medicare Other

## 2018-02-20 DIAGNOSIS — Z7901 Long term (current) use of anticoagulants: Secondary | ICD-10-CM

## 2018-02-20 DIAGNOSIS — N200 Calculus of kidney: Secondary | ICD-10-CM

## 2018-02-21 LAB — PROTIME-INR
INR: 1.8 — ABNORMAL HIGH (ref 0.8–1.2)
PROTHROMBIN TIME: 18.5 s — AB (ref 9.1–12.0)

## 2018-02-23 LAB — CULTURE, FUNGUS WITHOUT SMEAR: SPECIAL REQUESTS: NORMAL

## 2018-03-19 LAB — ACID FAST CULTURE WITH REFLEXED SENSITIVITIES (MYCOBACTERIA): Acid Fast Culture: NEGATIVE

## 2018-03-26 ENCOUNTER — Other Ambulatory Visit: Payer: Self-pay | Admitting: Family Medicine

## 2018-04-02 ENCOUNTER — Other Ambulatory Visit: Payer: Medicare Other

## 2018-04-02 DIAGNOSIS — Z7901 Long term (current) use of anticoagulants: Secondary | ICD-10-CM

## 2018-04-03 LAB — PROTIME-INR
INR: 1.7 — ABNORMAL HIGH (ref 0.8–1.2)
Prothrombin Time: 17 s — ABNORMAL HIGH (ref 9.1–12.0)

## 2018-04-04 ENCOUNTER — Other Ambulatory Visit: Payer: Self-pay | Admitting: Internal Medicine

## 2018-04-08 ENCOUNTER — Other Ambulatory Visit: Payer: Self-pay | Admitting: Family Medicine

## 2018-04-08 DIAGNOSIS — N400 Enlarged prostate without lower urinary tract symptoms: Secondary | ICD-10-CM

## 2018-04-13 ENCOUNTER — Emergency Department: Payer: Medicare Other

## 2018-04-13 ENCOUNTER — Emergency Department
Admission: EM | Admit: 2018-04-13 | Discharge: 2018-04-13 | Disposition: A | Discharge status: Home / Self Care | Payer: Medicare Other | Attending: Emergency Medicine | Admitting: Emergency Medicine

## 2018-04-13 ENCOUNTER — Ambulatory Visit (INDEPENDENT_AMBULATORY_CARE_PROVIDER_SITE_OTHER): Payer: Medicare Other | Admitting: Cardiothoracic Surgery

## 2018-04-13 ENCOUNTER — Other Ambulatory Visit: Payer: Medicare Other

## 2018-04-13 ENCOUNTER — Encounter: Payer: Self-pay | Admitting: Cardiothoracic Surgery

## 2018-04-13 VITALS — BP 166/80 | HR 87 | Temp 97.9°F | Resp 22 | Ht 72.0 in | Wt 275.0 lb

## 2018-04-13 DIAGNOSIS — J9601 Acute respiratory failure with hypoxia: Secondary | ICD-10-CM

## 2018-04-13 DIAGNOSIS — J441 Chronic obstructive pulmonary disease with (acute) exacerbation: Secondary | ICD-10-CM | POA: Diagnosis not present

## 2018-04-13 DIAGNOSIS — Z87891 Personal history of nicotine dependence: Secondary | ICD-10-CM | POA: Diagnosis not present

## 2018-04-13 DIAGNOSIS — Z7901 Long term (current) use of anticoagulants: Secondary | ICD-10-CM

## 2018-04-13 DIAGNOSIS — F329 Major depressive disorder, single episode, unspecified: Secondary | ICD-10-CM | POA: Insufficient documentation

## 2018-04-13 DIAGNOSIS — I10 Essential (primary) hypertension: Secondary | ICD-10-CM | POA: Diagnosis not present

## 2018-04-13 DIAGNOSIS — R0602 Shortness of breath: Secondary | ICD-10-CM | POA: Diagnosis present

## 2018-04-13 DIAGNOSIS — R69 Illness, unspecified: Secondary | ICD-10-CM

## 2018-04-13 LAB — BASIC METABOLIC PANEL
Anion gap: 9 (ref 5–15)
BUN: 16 mg/dL (ref 6–20)
CHLORIDE: 99 mmol/L (ref 98–111)
CO2: 28 mmol/L (ref 22–32)
Calcium: 8.8 mg/dL — ABNORMAL LOW (ref 8.9–10.3)
Creatinine, Ser: 1.2 mg/dL (ref 0.61–1.24)
GFR calc Af Amer: >60 mL/min (ref >60)
GFR calc non Af Amer: >60 mL/min (ref >60)
Glucose, Bld: 115 mg/dL — ABNORMAL HIGH (ref 70–99)
POTASSIUM: 3.8 mmol/L (ref 3.5–5.1)
Sodium: 136 mmol/L (ref 135–145)

## 2018-04-13 LAB — BRAIN NATRIURETIC PEPTIDE: B Natriuretic Peptide: 56 pg/mL (ref 0.0–100.0)

## 2018-04-13 LAB — CBC
HCT: 36.8 % — ABNORMAL LOW (ref 40.0–52.0)
Hemoglobin: 11.9 g/dL — ABNORMAL LOW (ref 13.0–18.0)
MCH: 28 pg (ref 26.0–34.0)
MCHC: 32.5 g/dL (ref 32.0–36.0)
MCV: 86.2 fL (ref 80.0–100.0)
PLATELETS: 322 10*3/uL (ref 150–440)
RBC: 4.27 MIL/uL — AB (ref 4.40–5.90)
RDW: 19 % — ABNORMAL HIGH (ref 11.5–14.5)
WBC: 14.7 10*3/uL — ABNORMAL HIGH (ref 3.8–10.6)

## 2018-04-13 LAB — PROTIME-INR
INR: 1.96
Prothrombin Time: 22.2 seconds — ABNORMAL HIGH (ref 11.4–15.2)

## 2018-04-13 LAB — TROPONIN I
Troponin I: <0.03 ng/mL (ref <0.03)
Troponin I: <0.03 ng/mL (ref <0.03)

## 2018-04-13 MED ORDER — IOPAMIDOL (ISOVUE-370) INJECTION 76%
75.0000 mL | Freq: Once | INTRAVENOUS | Status: AC | PRN
  Administered 2018-04-13: 75 mL via INTRAVENOUS

## 2018-04-13 MED ORDER — AZITHROMYCIN 250 MG PO TABS: ORAL_TABLET | ORAL | 0 refills | Status: AC

## 2018-04-13 MED ORDER — METHYLPREDNISOLONE SODIUM SUCC 125 MG IJ SOLR
125.0000 mg | Freq: Once | INTRAMUSCULAR | Status: AC
  Administered 2018-04-13: 125 mg via INTRAVENOUS
  Filled 2018-04-13: qty 2

## 2018-04-13 MED ORDER — IPRATROPIUM-ALBUTEROL 0.5-2.5 (3) MG/3ML IN SOLN
3.0000 mL | Freq: Once | RESPIRATORY_TRACT | Status: AC
Start: 2018-04-13 — End: 2018-04-13
  Administered 2018-04-13: 3 mL via RESPIRATORY_TRACT
  Filled 2018-04-13: qty 3

## 2018-04-13 NOTE — Patient Instructions (Addendum)
Patient to be scheduled for a lung biopsy.  Patient sent to emergency room.

## 2018-04-13 NOTE — ED Notes (Signed)
Ambulated with 3L/ [place]. Sats 90-94%.  Tolerated well.

## 2018-04-13 NOTE — ED Triage Notes (Signed)
Pt reports increase in SOB that began yesterday. Pt reports nonproductive cough. Pt wears chronic use of 3L . Chest pain that began yesterday.

## 2018-04-13 NOTE — Discharge Instructions (Signed)
Please continue to take your prednisone, as prescribed, and take the entire Z-Pak as prescribed.  Return to the emergency department if you develop severe pain, lightheadedness or fainting, fever, increasing shortness of breath, or any other symptoms concerning to you.
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
## 2018-04-13 NOTE — ED Notes (Signed)
AAOx3.  Skin warm and dry.  NAD 

## 2018-04-13 NOTE — ED Notes (Signed)
This RN ambulated around nurses' station with patient.  Patient tolerated well.  Patient's oxygen level dropped to around 90%.  MD notified.  Patient denies any distress with walking.  Continues to speak in full sentences without difficulty.
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
## 2018-04-13 NOTE — ED Notes (Signed)
Lunch tray given. 

## 2018-04-16 LAB — BLOOD GAS, VENOUS
ACID-BASE EXCESS: 3.6 mmol/L — AB (ref 0.0–2.0)
BICARBONATE: 30.1 mmol/L — AB (ref 20.0–28.0)
Patient temperature: 37
pCO2, Ven: 52 mmHg (ref 44.0–60.0)
pH, Ven: 7.37 (ref 7.250–7.430)

## 2018-04-18 LAB — CULTURE, BLOOD (ROUTINE X 2)
CULTURE: NO GROWTH
Culture: NO GROWTH
Special Requests: ADEQUATE

## 2018-04-30 ENCOUNTER — Other Ambulatory Visit: Payer: Medicare Other

## 2018-04-30 DIAGNOSIS — Z7901 Long term (current) use of anticoagulants: Secondary | ICD-10-CM

## 2018-05-01 LAB — PROTIME-INR
INR: 2.2 — AB (ref 0.8–1.2)
Prothrombin Time: 21.4 s — ABNORMAL HIGH (ref 9.1–12.0)

## 2018-05-02 ENCOUNTER — Other Ambulatory Visit: Payer: Self-pay | Admitting: Family Medicine

## 2018-05-02 DIAGNOSIS — E790 Hyperuricemia without signs of inflammatory arthritis and tophaceous disease: Secondary | ICD-10-CM

## 2018-05-02 DIAGNOSIS — J449 Chronic obstructive pulmonary disease, unspecified: Secondary | ICD-10-CM

## 2018-05-03 ENCOUNTER — Other Ambulatory Visit: Payer: Self-pay | Admitting: Family Medicine

## 2018-05-10 ENCOUNTER — Other Ambulatory Visit: Payer: Self-pay | Admitting: Family Medicine

## 2018-05-10 DIAGNOSIS — I10 Essential (primary) hypertension: Secondary | ICD-10-CM

## 2018-05-29 ENCOUNTER — Other Ambulatory Visit: Payer: Self-pay | Admitting: Family Medicine

## 2018-05-29 DIAGNOSIS — J209 Acute bronchitis, unspecified: Secondary | ICD-10-CM

## 2018-05-29 DIAGNOSIS — J449 Chronic obstructive pulmonary disease, unspecified: Secondary | ICD-10-CM

## 2018-05-29 DIAGNOSIS — J440 Chronic obstructive pulmonary disease with acute lower respiratory infection: Secondary | ICD-10-CM

## 2018-06-03 ENCOUNTER — Other Ambulatory Visit: Payer: Self-pay | Admitting: Family Medicine

## 2018-06-03 DIAGNOSIS — J449 Chronic obstructive pulmonary disease, unspecified: Secondary | ICD-10-CM

## 2018-06-03 DIAGNOSIS — J209 Acute bronchitis, unspecified: Secondary | ICD-10-CM

## 2018-06-03 DIAGNOSIS — J440 Chronic obstructive pulmonary disease with acute lower respiratory infection: Secondary | ICD-10-CM

## 2018-06-05 ENCOUNTER — Ambulatory Visit (INDEPENDENT_AMBULATORY_CARE_PROVIDER_SITE_OTHER): Payer: Medicare Other | Admitting: Family Medicine

## 2018-06-05 ENCOUNTER — Encounter: Payer: Self-pay | Admitting: Family Medicine

## 2018-06-05 VITALS — BP 120/58 | HR 100 | Ht 72.0 in | Wt 276.0 lb

## 2018-06-05 DIAGNOSIS — I482 Chronic atrial fibrillation, unspecified: Secondary | ICD-10-CM

## 2018-06-05 DIAGNOSIS — J42 Unspecified chronic bronchitis: Secondary | ICD-10-CM

## 2018-06-05 DIAGNOSIS — I10 Essential (primary) hypertension: Secondary | ICD-10-CM | POA: Diagnosis not present

## 2018-06-05 DIAGNOSIS — Z23 Encounter for immunization: Secondary | ICD-10-CM

## 2018-06-05 DIAGNOSIS — Z7901 Long term (current) use of anticoagulants: Secondary | ICD-10-CM | POA: Diagnosis not present

## 2018-06-05 DIAGNOSIS — N401 Enlarged prostate with lower urinary tract symptoms: Secondary | ICD-10-CM

## 2018-06-05 DIAGNOSIS — E790 Hyperuricemia without signs of inflammatory arthritis and tophaceous disease: Secondary | ICD-10-CM | POA: Diagnosis not present

## 2018-06-05 DIAGNOSIS — R350 Frequency of micturition: Secondary | ICD-10-CM

## 2018-06-05 MED ORDER — METOPROLOL TARTRATE 50 MG PO TABS: 50.0000 mg | ORAL_TABLET | Freq: Two times a day (BID) | ORAL | 1 refills | Status: AC

## 2018-06-05 MED ORDER — ALLOPURINOL 300 MG PO TABS: 300.0000 mg | ORAL_TABLET | Freq: Every day | ORAL | 1 refills | Status: AC

## 2018-06-05 MED ORDER — TAMSULOSIN HCL 0.4 MG PO CAPS: 0.4000 mg | ORAL_CAPSULE | Freq: Every day | ORAL | 1 refills | Status: AC

## 2018-06-05 MED ORDER — LISINOPRIL 10 MG PO TABS: 10.0000 mg | ORAL_TABLET | Freq: Every day | ORAL | 1 refills | Status: AC

## 2018-06-05 NOTE — Assessment & Plan Note (Signed)
Chronic persistent. Possible hypersensitivity pneumonitis. Awaiting appt at [facility].

## 2018-06-05 NOTE — Assessment & Plan Note (Signed)
Stable on meds- continue lisinopril and metoprolol as prescribed
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
## 2018-06-06 LAB — PROTIME-INR
INR: 3.1 — ABNORMAL HIGH (ref 0.8–1.2)
PROTHROMBIN TIME: 29.5 s — AB (ref 9.1–12.0)

## 2018-07-17 ENCOUNTER — Ambulatory Visit: Payer: Medicare Other

## 2018-07-17 DIAGNOSIS — I482 Chronic atrial fibrillation, unspecified: Secondary | ICD-10-CM

## 2018-07-17 DIAGNOSIS — Z7901 Long term (current) use of anticoagulants: Secondary | ICD-10-CM

## 2018-07-18 LAB — PROTIME-INR
INR: 4.3 — ABNORMAL HIGH (ref 0.8–1.2)
Prothrombin Time: 39.4 s — ABNORMAL HIGH (ref 9.1–12.0)

## 2018-07-27 ENCOUNTER — Other Ambulatory Visit: Payer: Self-pay | Admitting: Family Medicine

## 2018-08-07 ENCOUNTER — Other Ambulatory Visit: Payer: Medicare Other

## 2018-08-07 DIAGNOSIS — Z7901 Long term (current) use of anticoagulants: Secondary | ICD-10-CM

## 2018-08-08 LAB — PROTIME-INR
INR: 3.6 — ABNORMAL HIGH (ref 0.8–1.2)
Prothrombin Time: 33.2 s — ABNORMAL HIGH (ref 9.1–12.0)

## 2018-08-15 ENCOUNTER — Other Ambulatory Visit: Payer: Self-pay | Admitting: Family Medicine

## 2018-08-15 DIAGNOSIS — J449 Chronic obstructive pulmonary disease, unspecified: Secondary | ICD-10-CM

## 2018-08-17 ENCOUNTER — Other Ambulatory Visit: Payer: Self-pay | Admitting: Family Medicine

## 2018-08-17 ENCOUNTER — Other Ambulatory Visit: Payer: Self-pay

## 2018-08-17 DIAGNOSIS — J440 Chronic obstructive pulmonary disease with acute lower respiratory infection: Secondary | ICD-10-CM

## 2018-08-17 DIAGNOSIS — J449 Chronic obstructive pulmonary disease, unspecified: Secondary | ICD-10-CM

## 2018-08-17 DIAGNOSIS — J209 Acute bronchitis, unspecified: Secondary | ICD-10-CM

## 2018-08-17 MED ORDER — ALBUTEROL SULFATE (2.5 MG/3ML) 0.083% IN NEBU: INHALATION_SOLUTION | RESPIRATORY_TRACT | 1 refills | Status: AC

## 2018-09-03 ENCOUNTER — Other Ambulatory Visit: Payer: Self-pay | Admitting: Gastroenterology

## 2018-09-03 ENCOUNTER — Other Ambulatory Visit: Payer: Medicare Other

## 2018-09-03 DIAGNOSIS — K5909 Other constipation: Secondary | ICD-10-CM

## 2018-09-03 DIAGNOSIS — K5790 Diverticulosis of intestine, part unspecified, without perforation or abscess without bleeding: Secondary | ICD-10-CM

## 2018-09-03 DIAGNOSIS — Z7901 Long term (current) use of anticoagulants: Secondary | ICD-10-CM

## 2018-09-03 DIAGNOSIS — I482 Chronic atrial fibrillation, unspecified: Secondary | ICD-10-CM

## 2018-09-04 ENCOUNTER — Other Ambulatory Visit: Payer: Self-pay

## 2018-09-04 DIAGNOSIS — Z7901 Long term (current) use of anticoagulants: Secondary | ICD-10-CM

## 2018-09-04 LAB — PROTIME-INR
INR: 5.3 — AB (ref 0.8–1.2)
PROTHROMBIN TIME: 47.4 s — AB (ref 9.1–12.0)

## 2018-09-04 MED ORDER — WARFARIN SODIUM 2 MG PO TABS: 2.0000 mg | ORAL_TABLET | Freq: Every day | ORAL | 1 refills | Status: AC

## 2018-09-12 ENCOUNTER — Other Ambulatory Visit: Payer: Self-pay | Admitting: Gastroenterology

## 2018-09-12 ENCOUNTER — Ambulatory Visit
Admission: RE | Admit: 2018-09-12 | Discharge: 2018-09-12 | Disposition: A | Discharge status: Home / Self Care | Payer: Medicare Other | Source: Ambulatory Visit | Attending: Gastroenterology | Admitting: Gastroenterology

## 2018-09-12 DIAGNOSIS — K5900 Constipation, unspecified: Secondary | ICD-10-CM

## 2018-09-13 ENCOUNTER — Other Ambulatory Visit: Payer: Self-pay | Admitting: Family Medicine

## 2018-09-13 ENCOUNTER — Other Ambulatory Visit: Payer: Self-pay

## 2018-09-13 DIAGNOSIS — J449 Chronic obstructive pulmonary disease, unspecified: Secondary | ICD-10-CM

## 2018-09-13 MED ORDER — ALBUTEROL SULFATE (2.5 MG/3ML) 0.083% IN NEBU: INHALATION_SOLUTION | RESPIRATORY_TRACT | 0 refills | Status: AC

## 2018-09-23 ENCOUNTER — Telehealth: Payer: Self-pay | Admitting: Family Medicine

## 2018-09-23 MED ORDER — SENNOSIDES 8.6 MG PO TABS
2.00 | ORAL_TABLET | ORAL | Status: DC
Start: 2018-09-23 — End: 2018-09-23

## 2018-09-23 MED ORDER — GENERIC EXTERNAL MEDICATION
Status: DC
Start: ? — End: 2018-09-23

## 2018-09-23 MED ORDER — MORPHINE SULFATE ER 30 MG PO TBCR
60.00 | EXTENDED_RELEASE_TABLET | ORAL | Status: DC
Start: 2018-09-23 — End: 2018-09-23

## 2018-09-23 MED ORDER — BISACODYL 10 MG/30ML RE ENEM
10.00 | ENEMA | RECTAL | Status: DC
Start: ? — End: 2018-09-23

## 2018-09-23 MED ORDER — DULOXETINE HCL 60 MG PO CPEP
60.00 | ORAL_CAPSULE | ORAL | Status: DC
Start: 2018-09-24 — End: 2018-09-23

## 2018-09-23 MED ORDER — ALUM & MAG HYDROXIDE-SIMETH 400-400-40 MG/5ML PO SUSP
30.00 | ORAL | Status: DC
Start: ? — End: 2018-09-23

## 2018-09-23 MED ORDER — GENERIC EXTERNAL MEDICATION
450.00 | Status: DC
Start: 2018-09-24 — End: 2018-09-23

## 2018-09-23 MED ORDER — ALLOPURINOL 300 MG PO TABS
300.00 | ORAL_TABLET | ORAL | Status: DC
Start: 2018-09-24 — End: 2018-09-23

## 2018-09-23 MED ORDER — CLONAZEPAM 0.5 MG PO TABS
0.50 | ORAL_TABLET | ORAL | Status: DC
Start: 2018-09-24 — End: 2018-09-23

## 2018-09-23 MED ORDER — TAMSULOSIN HCL 0.4 MG PO CAPS
0.40 | ORAL_CAPSULE | ORAL | Status: DC
Start: 2018-09-23 — End: 2018-09-23

## 2018-09-23 MED ORDER — POLYETHYLENE GLYCOL 3350 17 G PO PACK
17.00 | PACK | ORAL | Status: DC
Start: 2018-09-24 — End: 2018-09-23

## 2018-09-23 MED ORDER — GENERIC EXTERNAL MEDICATION
8.00 | Status: DC
Start: ? — End: 2018-09-23

## 2018-09-23 MED ORDER — GENERIC EXTERNAL MEDICATION
2.00 | Status: DC
Start: ? — End: 2018-09-23

## 2018-09-23 MED ORDER — GUAIFENESIN 100 MG/5ML PO SYRP
200.00 | ORAL_SOLUTION | ORAL | Status: DC
Start: ? — End: 2018-09-23

## 2018-09-23 MED ORDER — MORPHINE SULFATE (CONCENTRATE) 10 MG /0.5 ML PO SOLN
30.00 | ORAL | Status: DC
Start: ? — End: 2018-09-23

## 2018-09-23 MED ORDER — MELATONIN 3 MG PO TABS
6.00 | ORAL_TABLET | ORAL | Status: DC
Start: ? — End: 2018-09-23

## 2018-09-23 MED ORDER — METOPROLOL TARTRATE 50 MG PO TABS
50.00 | ORAL_TABLET | ORAL | Status: DC
Start: 2018-09-23 — End: 2018-09-23

## 2018-09-23 MED ORDER — GENERIC EXTERNAL MEDICATION
45.00 | Status: DC
Start: 2018-09-24 — End: 2018-09-23

## 2018-09-23 MED ORDER — LUBIPROSTONE 24 MCG PO CAPS
24.00 | ORAL_CAPSULE | ORAL | Status: DC
Start: 2018-09-23 — End: 2018-09-23

## 2018-09-23 MED ORDER — GENERIC EXTERNAL MEDICATION
30.00 | Status: DC
Start: 2018-09-24 — End: 2018-09-23

## 2018-09-23 MED ORDER — ALBUTEROL SULFATE HFA 108 (90 BASE) MCG/ACT IN AERS
1.00 | INHALATION_SPRAY | RESPIRATORY_TRACT | Status: DC
Start: ? — End: 2018-09-23

## 2018-09-23 MED ORDER — PANTOPRAZOLE SODIUM 40 MG PO TBEC
40.00 | DELAYED_RELEASE_TABLET | ORAL | Status: DC
Start: 2018-09-24 — End: 2018-09-23

## 2018-09-23 MED ORDER — DOCUSATE SODIUM 100 MG PO CAPS
200.00 | ORAL_CAPSULE | ORAL | Status: DC
Start: 2018-09-23 — End: 2018-09-23

## 2018-09-26 NOTE — Telephone Encounter (Signed)
Covering for [facility], received nurse triage call regarding this patient, they were notified by [facility] that patient is deceased. Call was made by Lesia Sago of [facility], patient was found unresponsive at home, by brother, about 10 min after they returned from [facility], his PCP is Dr Otilio Miu, from chart review, patient has history of COPD with chronic respiratory failure. He was treated with resuscitation efforts on site including advanced airway and IV medications, unable to revive him as he remained in asystole. He was pronounced deceased approximately 430pm EST estimate based on the time phone call was made. They requested permission for his PCP to sign death certificate, they will make arrangements with funeral home. It will be sent to Dr Ronnald Ramp to sign this week.  Kenneth Wood, Kenneth Wood Sep 24, 2018, 4:48 PM

## 2018-09-26 DEATH — deceased
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
